# Patient Record
Sex: Female | Born: 1950 | Race: White | Hispanic: No | Marital: Married | Smoking: Never smoker
Health system: Southern US, Community
[De-identification: ages and names within clinical notes are randomized; demographics above are authoritative.]

## PROBLEM LIST (undated history)

## (undated) DIAGNOSIS — N390 Urinary tract infection, site not specified: Secondary | ICD-10-CM

## (undated) DIAGNOSIS — E119 Type 2 diabetes mellitus without complications: Secondary | ICD-10-CM

## (undated) DIAGNOSIS — K219 Gastro-esophageal reflux disease without esophagitis: Secondary | ICD-10-CM

## (undated) DIAGNOSIS — F329 Major depressive disorder, single episode, unspecified: Secondary | ICD-10-CM

## (undated) DIAGNOSIS — E785 Hyperlipidemia, unspecified: Secondary | ICD-10-CM

## (undated) DIAGNOSIS — F32A Depression, unspecified: Secondary | ICD-10-CM

## (undated) DIAGNOSIS — I10 Essential (primary) hypertension: Secondary | ICD-10-CM

## (undated) HISTORY — PX: TONSILLECTOMY: SUR1361

## (undated) HISTORY — PX: COLONOSCOPY: SHX174

## (undated) HISTORY — DX: Type 2 diabetes mellitus without complications: E11.9

## (undated) HISTORY — PX: CHOLECYSTECTOMY: SHX55

---

## 2006-10-09 ENCOUNTER — Encounter: Admission: RE | Admit: 2006-10-09 | Discharge: 2006-10-09 | Payer: Self-pay | Admitting: Unknown Physician Specialty

## 2006-12-07 ENCOUNTER — Ambulatory Visit: Payer: Self-pay | Admitting: Cardiology

## 2006-12-22 ENCOUNTER — Ambulatory Visit: Payer: Self-pay | Admitting: Cardiology

## 2008-03-07 ENCOUNTER — Encounter: Admission: RE | Admit: 2008-03-07 | Discharge: 2008-03-07 | Payer: Self-pay | Admitting: Unknown Physician Specialty

## 2009-07-24 ENCOUNTER — Encounter: Admission: RE | Admit: 2009-07-24 | Discharge: 2009-07-24 | Payer: Self-pay | Admitting: Obstetrics and Gynecology

## 2010-08-26 ENCOUNTER — Encounter: Admission: RE | Admit: 2010-08-26 | Discharge: 2010-08-26 | Payer: Self-pay | Admitting: Unknown Physician Specialty

## 2010-10-13 ENCOUNTER — Encounter: Payer: Self-pay | Admitting: Unknown Physician Specialty

## 2011-02-07 NOTE — Assessment & Plan Note (Signed)
[facility]                          [facility] OFFICE NOTE   Heather, Fleming                   MRN:          [id number]  DATE:12/07/2006                            DOB:          Jan 22, 1951    REFERRING PHYSICIAN:  Wyvonnia Lora   REASON FOR CONSULTATION:  Evaluation of atypical chest pain in a 60-year-  old female.   HISTORY OF PRESENT ILLNESS:  The patient is a 60 year old female with no  prior history of coronary artery disease. The patient does have a strong  family history for coronary artery disease. She has a brother who has a  history of sudden cardiac death, __________ by multiple cardiac risk  factors who suffered myocardial infarction and also underwent  defibrillator implant. The patient does have a strong family history of  tobacco use but she herself does not smoke.   The patient states that over the last several weeks she has had left-  sided submammary chest pain which she happens off and on. She states  this pain feels rather sharp, it lasts usually typically several  minutes, sometimes up to several hours. There is no definite  exacerbating factors, in particular exertion or emotional stress does  not bring on her symptoms. The patient states that she has no associated  symptoms of shortness of breath or diaphoresis and there is also no  radiation of the pain. The patient is rather active although she feels  she is deconditioned, activity does not particularly worsen her  symptoms. She is concerned that she might actually have gastroesophageal  reflux disease. She has modified her diet and has increased her fruit  and vegetables. She does have cardiac risk factors but she is compliant  with her medical regimen, in particular her Lipitor and antihypertensive  blood pressure medication. Her blood pressure is somewhat elevated in  the office today but she reports that on the recent visit with Dr.  Mora Appl her diastolic  blood pressure was less than 85. The patient denies  any orthopnea, PND, she has no palpitations, or syncope. She has no  nausea or vomiting. She has no fever or chills. She has no melena,  hematochezia, dysuria, or frequency. She has no syncope. The remainder  of her review of systems also notable for the fact that she reports  heartburn and frequent flatulence.   ALLERGIES:  CODEINE.   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 She does not smoke or drink.   FAMILY HISTORY:  Notable for mother and father that died both from heart  disease and diabetes mellitus. A brother has coronary artery disease and  is status post ICD implant secondary to sudden cardiac death.   CURRENT MEDICATIONS:  1. Astelin nasal spray secondary to chronic rhinitis.  2. Vitamins.  3. Amitriptyline 25 mg p.o. q.day.  4. Lipitor 40 mg p.o. q.day.  5. Omeprazole 20 mg p.o. q.day.  6. Lisinopril/hydrochlorothiazide 20/25 mg p.o. q.day.  7. Citalopram 20 mg p.o. q.day.  8. Fexofenadine 180 mg p.o. q.day.   REVIEW OF SYSTEMS:  As  reported above.   PHYSICAL EXAMINATION:  VITAL SIGNS: Blood pressure 142/88, heart rate is  105 beats per minute but during my physical examination the heart rate  was around 90 beats per minute.  GENERAL: Well-nourished, white female, in no apparent distress.  HEENT: Pupils are __________.  Sclerae and conjunctivae clear.  NECK: Supple, normal carotid upstrokes, no carotid bruits.  LUNGS: Clear breath sounds bilaterally.  HEART: Regular rate and rhythm, normal S1, S2. No murmur, rubs, or  gallops.  ABDOMEN: Soft, nontender. No rebound or guarding. Good bowel sounds.  EXTREMITY EXAM: Reveals no cyanosis, clubbing, or edema.  NEURO: Patient alert, oriented, grossly nonfocal.   A 12-lead electrocardiogram sinus tachycardia with borderline left  atrial enlargement, incomplete right bundle branch block, but otherwise   within normal limits.   PROBLEMS:  1. Atypical chest pain.  2. Rule out gastroesophageal reflux disease.  3. Dyslipidemia.  4. Hypertension.  5. Obesity and deconditioning.   PLAN:  1. The patient's symptoms are somewhat atypical for underlying      coronary artery disease. She does have risk factors but her pretest      probability is low.  2. The patient will proceed to further risk stratification and      diagnostic testing with stress Cardiolite study later this week.  3. The patient states that she has not had her cholesterol checked in      sometime and we will have that done at the same time of her stress      test.  4. If the Cardiolite studies within normal limit. I told the patient      that we can not rule out minor coronary artery disease, i.e.      arthrosclerosis but that she would be at low risk for future      cardiac events. I also do not think she needs any further cardiac      workup if the stress test is within normal limits, but rather      further evaluation for gastrointestinal problems as indicated.     Learta Codding, MD,FACC  Electronically Signed    GED/MedQ  DD: 12/07/2006  DT: 12/07/2006  Job #: [id number]   cc:   Lynita Lombard

## 2011-08-06 ENCOUNTER — Other Ambulatory Visit: Payer: Self-pay | Admitting: Unknown Physician Specialty

## 2011-08-06 DIAGNOSIS — Z1231 Encounter for screening mammogram for malignant neoplasm of breast: Secondary | ICD-10-CM

## 2011-09-05 ENCOUNTER — Ambulatory Visit
Admission: RE | Admit: 2011-09-05 | Discharge: 2011-09-05 | Disposition: A | Discharge status: Home / Self Care | Payer: Commercial Indemnity | Source: Ambulatory Visit | Attending: Unknown Physician Specialty | Admitting: Unknown Physician Specialty

## 2011-09-05 DIAGNOSIS — Z1231 Encounter for screening mammogram for malignant neoplasm of breast: Secondary | ICD-10-CM

## 2011-09-11 ENCOUNTER — Other Ambulatory Visit: Payer: Self-pay | Admitting: Unknown Physician Specialty

## 2011-09-11 DIAGNOSIS — R928 Other abnormal and inconclusive findings on diagnostic imaging of breast: Secondary | ICD-10-CM

## 2011-09-24 ENCOUNTER — Ambulatory Visit
Admission: RE | Admit: 2011-09-24 | Discharge: 2011-09-24 | Disposition: A | Discharge status: Home / Self Care | Payer: Commercial Indemnity | Source: Ambulatory Visit | Attending: Unknown Physician Specialty | Admitting: Unknown Physician Specialty

## 2011-09-24 DIAGNOSIS — R928 Other abnormal and inconclusive findings on diagnostic imaging of breast: Secondary | ICD-10-CM

## 2012-04-06 ENCOUNTER — Other Ambulatory Visit: Payer: Self-pay | Admitting: Unknown Physician Specialty

## 2012-04-06 DIAGNOSIS — N6489 Other specified disorders of breast: Secondary | ICD-10-CM

## 2012-04-20 ENCOUNTER — Ambulatory Visit
Admission: RE | Admit: 2012-04-20 | Discharge: 2012-04-20 | Disposition: A | Discharge status: Home / Self Care | Payer: Managed Care, Other (non HMO) | Source: Ambulatory Visit | Attending: Unknown Physician Specialty | Admitting: Unknown Physician Specialty

## 2012-04-20 DIAGNOSIS — N6489 Other specified disorders of breast: Secondary | ICD-10-CM

## 2012-10-08 ENCOUNTER — Other Ambulatory Visit: Payer: Self-pay | Admitting: Unknown Physician Specialty

## 2012-10-08 DIAGNOSIS — N6489 Other specified disorders of breast: Secondary | ICD-10-CM

## 2012-10-19 ENCOUNTER — Ambulatory Visit
Admission: RE | Admit: 2012-10-19 | Discharge: 2012-10-19 | Disposition: A | Discharge status: Home / Self Care | Payer: Managed Care, Other (non HMO) | Source: Ambulatory Visit | Attending: Unknown Physician Specialty | Admitting: Unknown Physician Specialty

## 2012-10-19 DIAGNOSIS — N6489 Other specified disorders of breast: Secondary | ICD-10-CM

## 2014-01-11 ENCOUNTER — Other Ambulatory Visit: Payer: Self-pay

## 2014-01-11 DIAGNOSIS — Z1231 Encounter for screening mammogram for malignant neoplasm of breast: Secondary | ICD-10-CM

## 2014-02-06 ENCOUNTER — Ambulatory Visit: Payer: Managed Care, Other (non HMO)

## 2014-08-24 ENCOUNTER — Encounter (INDEPENDENT_AMBULATORY_CARE_PROVIDER_SITE_OTHER): Payer: Self-pay

## 2014-08-24 ENCOUNTER — Ambulatory Visit
Admission: RE | Admit: 2014-08-24 | Discharge: 2014-08-24 | Disposition: A | Discharge status: Home / Self Care | Payer: Medicare Other | Source: Ambulatory Visit

## 2014-08-24 DIAGNOSIS — Z1231 Encounter for screening mammogram for malignant neoplasm of breast: Secondary | ICD-10-CM

## 2014-10-19 ENCOUNTER — Encounter (INDEPENDENT_AMBULATORY_CARE_PROVIDER_SITE_OTHER): Payer: Self-pay | Admitting: *Deleted

## 2014-10-19 ENCOUNTER — Encounter (INDEPENDENT_AMBULATORY_CARE_PROVIDER_SITE_OTHER): Payer: Self-pay

## 2015-03-21 ENCOUNTER — Ambulatory Visit
Admission: RE | Admit: 2015-03-21 | Discharge: 2015-03-21 | Disposition: A | Discharge status: Home / Self Care | Payer: Medicare Other | Source: Ambulatory Visit | Attending: Otolaryngology | Admitting: Otolaryngology

## 2015-03-21 ENCOUNTER — Other Ambulatory Visit: Payer: Self-pay | Admitting: Otolaryngology

## 2015-03-21 DIAGNOSIS — J329 Chronic sinusitis, unspecified: Secondary | ICD-10-CM

## 2015-09-06 ENCOUNTER — Other Ambulatory Visit: Payer: Self-pay

## 2015-09-06 DIAGNOSIS — Z1231 Encounter for screening mammogram for malignant neoplasm of breast: Secondary | ICD-10-CM

## 2015-09-12 ENCOUNTER — Ambulatory Visit
Admission: RE | Admit: 2015-09-12 | Discharge: 2015-09-12 | Disposition: A | Discharge status: Home / Self Care | Payer: Medicare Other | Source: Ambulatory Visit

## 2015-09-12 DIAGNOSIS — Z1231 Encounter for screening mammogram for malignant neoplasm of breast: Secondary | ICD-10-CM

## 2015-11-02 ENCOUNTER — Emergency Department (HOSPITAL_COMMUNITY)
Admission: EM | Admit: 2015-11-02 | Discharge: 2015-11-02 | Disposition: A | Discharge status: Home / Self Care | Payer: Medicare Other | Attending: Emergency Medicine | Admitting: Emergency Medicine

## 2015-11-02 ENCOUNTER — Emergency Department (HOSPITAL_COMMUNITY): Payer: Medicare Other

## 2015-11-02 ENCOUNTER — Encounter (HOSPITAL_COMMUNITY): Payer: Self-pay | Admitting: Nurse Practitioner

## 2015-11-02 DIAGNOSIS — Z7951 Long term (current) use of inhaled steroids: Secondary | ICD-10-CM | POA: Insufficient documentation

## 2015-11-02 DIAGNOSIS — K219 Gastro-esophageal reflux disease without esophagitis: Secondary | ICD-10-CM | POA: Diagnosis not present

## 2015-11-02 DIAGNOSIS — N390 Urinary tract infection, site not specified: Secondary | ICD-10-CM | POA: Insufficient documentation

## 2015-11-02 DIAGNOSIS — I10 Essential (primary) hypertension: Secondary | ICD-10-CM | POA: Insufficient documentation

## 2015-11-02 DIAGNOSIS — Z79899 Other long term (current) drug therapy: Secondary | ICD-10-CM | POA: Diagnosis not present

## 2015-11-02 DIAGNOSIS — E785 Hyperlipidemia, unspecified: Secondary | ICD-10-CM | POA: Insufficient documentation

## 2015-11-02 DIAGNOSIS — R0602 Shortness of breath: Secondary | ICD-10-CM | POA: Diagnosis not present

## 2015-11-02 DIAGNOSIS — R109 Unspecified abdominal pain: Secondary | ICD-10-CM | POA: Diagnosis present

## 2015-11-02 DIAGNOSIS — Z91040 Latex allergy status: Secondary | ICD-10-CM | POA: Diagnosis not present

## 2015-11-02 HISTORY — DX: Essential (primary) hypertension: I10

## 2015-11-02 HISTORY — DX: Urinary tract infection, site not specified: N39.0

## 2015-11-02 HISTORY — DX: Hyperlipidemia, unspecified: E78.5

## 2015-11-02 HISTORY — DX: Gastro-esophageal reflux disease without esophagitis: K21.9

## 2015-11-02 LAB — BASIC METABOLIC PANEL
Anion gap: 11 (ref 5–15)
BUN: 12 mg/dL (ref 6–20)
CALCIUM: 9.3 mg/dL (ref 8.9–10.3)
CO2: 26 mmol/L (ref 22–32)
CREATININE: 0.88 mg/dL (ref 0.44–1.00)
Chloride: 104 mmol/L (ref 101–111)
GFR calc Af Amer: >60 mL/min (ref >60)
GFR calc non Af Amer: >60 mL/min (ref >60)
GLUCOSE: 105 mg/dL — AB (ref 65–99)
Potassium: 4.3 mmol/L (ref 3.5–5.1)
Sodium: 141 mmol/L (ref 135–145)

## 2015-11-02 LAB — CBC
HEMATOCRIT: 39.1 % (ref 36.0–46.0)
Hemoglobin: 13 g/dL (ref 12.0–15.0)
MCH: 30.5 pg (ref 26.0–34.0)
MCHC: 33.2 g/dL (ref 30.0–36.0)
MCV: 91.8 fL (ref 78.0–100.0)
Platelets: 278 10*3/uL (ref 150–400)
RBC: 4.26 MIL/uL (ref 3.87–5.11)
RDW: 13 % (ref 11.5–15.5)
WBC: 8.7 10*3/uL (ref 4.0–10.5)

## 2015-11-02 LAB — I-STAT TROPONIN, ED: Troponin i, poc: 0 ng/mL (ref 0.00–0.08)

## 2015-11-02 MED ORDER — SULFAMETHOXAZOLE-TRIMETHOPRIM 800-160 MG PO TABS
1.0000 | ORAL_TABLET | Freq: Once | ORAL | Status: AC
  Administered 2015-11-02: 1 via ORAL
  Filled 2015-11-02: qty 1

## 2015-11-02 NOTE — ED Notes (Addendum)
She c/o 1 day history of severe indigestion and gas. Onset after eating a chalupa at lunch. She noticed symptoms were worse when lying down last night and then she began to have SOB and abd tenderness which has persisted until today. She took her acid reducers with no relief. Nothing aggravates or alleviates the symptoms. She went to [facility] for this and they found a UTI but wanted her evaluated further for abd pain and sob. A&Ox4, resp e/u. She increased her gabapentin dosage 2 weeks ago.

## 2015-11-02 NOTE — ED Provider Notes (Signed)
CSN: [id number]     Arrival date & time 11/02/15  1355 History   First MD Initiated Contact with Patient 11/02/15 2156     Chief Complaint  Patient presents with  . Shortness of Breath      Patient is a 65 y.o. female presenting with shortness of breath. The history is provided by the patient.  Shortness of Breath Associated symptoms: abdominal pain   Associated symptoms: no chest pain, no fever, no headaches, no rash and no vomiting    patient had some shortness of breath last night. She was seen at urgent care and sent to the ER further evaluation. She been waiting around 8 hours to see me. She states she was sleeping and felt so her reflux. States she felt little short of breath with it. No chest pain. She did have some mild abdominal pain. She states she felt as if she had a urinary tract infection she states that she was diagnosed with one at the urgent care. No fevers. No chills. No swelling or legs. No flank pain. She does have a history of GERD.  Past Medical History  Diagnosis Date  . GERD (gastroesophageal reflux disease)   . Hypertension   . Hyperlipemia   . UTI (urinary tract infection)    Past Surgical History  Procedure Laterality Date  . Cholecystectomy    . Tonsillectomy     History reviewed. No pertinent family history. Social History  Substance Use Topics  . Smoking status: Never Smoker   . Smokeless tobacco: None  . Alcohol Use: Yes     Comment: occasionally   OB History    No data available     Review of Systems  Constitutional: Negative for fever, activity change and appetite change.  Eyes: Negative for pain.  Respiratory: Positive for shortness of breath. Negative for chest tightness.   Cardiovascular: Negative for chest pain and leg swelling.  Gastrointestinal: Positive for abdominal pain. Negative for nausea, vomiting and diarrhea.  Genitourinary: Positive for dysuria. Negative for flank pain.  Musculoskeletal: Negative for back pain and neck  stiffness.  Skin: Negative for rash and wound.  Neurological: Negative for weakness, numbness and headaches.  Psychiatric/Behavioral: Negative for behavioral problems.      Allergies  Codeine and Latex  Home Medications   Prior to Admission medications   Medication Sig Start Date End Date Taking? Authorizing Provider  atorvastatin (LIPITOR) 40 MG tablet Take 40 mg by mouth daily. 09/25/15  Yes Historical Provider, MD  citalopram (CELEXA) 20 MG tablet Take 20 mg by mouth daily. 09/25/15  Yes Historical Provider, MD  fluticasone (FLONASE) 50 MCG/ACT nasal spray Place 1 spray into both nostrils daily.  09/25/15  Yes Historical Provider, MD  gabapentin (NEURONTIN) 300 MG capsule Take 300 mg by mouth 3 (three) times daily. 11/02/15  Yes Historical Provider, MD  losartan-hydrochlorothiazide (HYZAAR) 100-25 MG tablet Take 1 tablet by mouth daily. 09/25/15  Yes Historical Provider, MD  omeprazole (PRILOSEC) 20 MG capsule Take 20 mg by mouth daily. 09/25/15  Yes Historical Provider, MD  potassium chloride (K-DUR,KLOR-CON) 10 MEQ tablet Take 10 mEq by mouth 2 (two) times daily. 09/25/15  Yes Historical Provider, MD  propranolol (INDERAL) 20 MG tablet Take 20 mg by mouth 2 (two) times daily. 09/25/15  Yes Historical Provider, MD  sulfamethoxazole-trimethoprim (BACTRIM DS,SEPTRA DS) 800-160 MG tablet Take 1 tablet by mouth 2 (two) times daily. 11/02/15  Yes Historical Provider, MD   BP 145/92 mmHg  Pulse 63  Temp(Src)  98.1 F (36.7 C) (Oral)  Resp 17  SpO2 97% Physical Exam  Constitutional: She is oriented to person, place, and time. She appears well-developed and well-nourished.  HENT:  Head: Normocephalic and atraumatic.  Eyes: Pupils are equal, round, and reactive to light.  Neck: No JVD present.  Cardiovascular: Normal rate, regular rhythm and normal heart sounds.   No murmur heard. Pulmonary/Chest: Effort normal and breath sounds normal. No respiratory distress.  Abdominal: Soft. Bowel sounds are  normal. She exhibits no distension. There is tenderness. There is no rebound and no guarding.  Mild suprapubic to right lower quadrant tenderness. No rebound or guarding. No mass.  Musculoskeletal: Normal range of motion.  Neurological: She is alert and oriented to person, place, and time. No cranial nerve deficit.  Skin: Skin is warm and dry.  Psychiatric: She has a normal mood and affect. Her speech is normal.  Nursing note and vitals reviewed.   ED Course  Procedures (including critical care time) Labs Review Labs Reviewed  BASIC METABOLIC PANEL - Abnormal; Notable for the following:    Glucose, Bld 105 (*)    All other components within normal limits  CBC  I-STAT TROPOININ, ED    Imaging Review Dg Chest 2 View  11/02/2015  CLINICAL DATA:  New onset of shortness of breath. EXAM: CHEST  2 VIEW COMPARISON:  None. FINDINGS: The heart size and mediastinal contours are within normal limits. Both lungs are clear. The visualized skeletal structures are unremarkable. Calcification in the arch of the aorta. No effusions. IMPRESSION: No active cardiopulmonary disease.  Aortic atherosclerosis. Electronically Signed   By: Lorriane Shire M.D.   On: 11/02/2015 14:30   I have personally reviewed and evaluated these images and lab results as part of my medical decision-making.   EKG Interpretation   Date/Time:  Friday November 02 2015 14:02:12 EST Ventricular Rate:  64 PR Interval:  164 QRS Duration: 92 QT Interval:  424 QTC Calculation: 437 R Axis:   68 Text Interpretation:  Normal sinus rhythm Normal ECG Confirmed by  Alvino Chapel  MD, Ovid Curd [phone]) on 11/02/2015 9:56:48 PM      MDM   Final diagnoses:  Lower urinary tract infection    Patient with dyspnea. EKG x-ray and lab work reassuring. Doubt pulmonary embolism doubt pneumonia. Doubt cardiac cause. Does have some lower abdominal pain. No epigastric tenderness. May be related to UTI. Reportedly a positive urinalysis at the urgent  care. Was started on Bactrim there and will give the first dose here. Will discharge home.    Davonna Belling, MD 11/02/15 2214

## 2015-11-02 NOTE — Discharge Instructions (Signed)

## 2015-11-02 NOTE — ED Notes (Signed)
Pt verbalized understanding of antibiotic regimen for treatment of UTI. Pt stable and NAD

## 2015-12-27 ENCOUNTER — Telehealth (INDEPENDENT_AMBULATORY_CARE_PROVIDER_SITE_OTHER): Payer: Self-pay | Admitting: *Deleted

## 2015-12-27 NOTE — Telephone Encounter (Signed)
Patient called wanting to schedule an appointment to setup a colonoscopy.  Letter was sent to patient 09/2014  [place]

## 2015-12-28 ENCOUNTER — Other Ambulatory Visit (INDEPENDENT_AMBULATORY_CARE_PROVIDER_SITE_OTHER): Payer: Self-pay | Admitting: *Deleted

## 2015-12-28 DIAGNOSIS — Z86010 Personal history of colonic polyps: Secondary | ICD-10-CM

## 2015-12-28 NOTE — Telephone Encounter (Signed)
Left message for patient to call to schedule. 

## 2015-12-28 NOTE — Telephone Encounter (Signed)
TCS sch'd 04/03/16, patient aware

## 2016-02-28 ENCOUNTER — Other Ambulatory Visit (INDEPENDENT_AMBULATORY_CARE_PROVIDER_SITE_OTHER): Payer: Self-pay | Admitting: *Deleted

## 2016-02-28 ENCOUNTER — Encounter (INDEPENDENT_AMBULATORY_CARE_PROVIDER_SITE_OTHER): Payer: Self-pay | Admitting: *Deleted

## 2016-02-28 NOTE — Telephone Encounter (Signed)
Patient needs trilyte 

## 2016-03-03 MED ORDER — PEG 3350-KCL-NA BICARB-NACL 420 G PO SOLR
4000.0000 mL | Freq: Once | ORAL | Status: DC
Start: 2016-03-03 — End: 2016-04-03

## 2016-03-05 ENCOUNTER — Telehealth (INDEPENDENT_AMBULATORY_CARE_PROVIDER_SITE_OTHER): Payer: Self-pay | Admitting: *Deleted

## 2016-03-05 NOTE — Telephone Encounter (Signed)
Referring MD/PCP: tapper   Procedure: tcs  Reason/Indication:  Hx polyps  Has patient had this procedure before?  Yes, 2011 -- scanned  If so, when, by whom and where?    Is there a family history of colon cancer?  no  Who?  What age when diagnosed?    Is patient diabetic?   borderline      Does patient have prosthetic heart valve or mechanical valve?  no  Do you have a pacemaker?  no  Has patient ever had endocarditis? no  Has patient had joint replacement within last 12 months?  no  Does patient tend to be constipated or take laxatives? no  Does patient have a history of alcohol/drug use?  no  Is patient on Coumadin, Plavix and/or Aspirin? yes  Medications: asa 81 mg daily, citalopram 20 mg daily, propranolol 20 mg bid, potassium 10 meq bid, gabapentin 300 mg bid, atorvastatin 40 mg daily, omeprazole 20 mg daily, losartan/hctz 100/25 mg daily, cetirizine 10 mg daily  Allergies: codeine, latex  Medication Adjustment: asa 2 days  Procedure date & time: 04/03/16 at 10:00

## 2016-03-05 NOTE — Telephone Encounter (Signed)
agree

## 2016-04-03 ENCOUNTER — Encounter (HOSPITAL_COMMUNITY): Payer: Self-pay | Admitting: *Deleted

## 2016-04-03 ENCOUNTER — Ambulatory Visit (HOSPITAL_COMMUNITY)
Admission: RE | Admit: 2016-04-03 | Discharge: 2016-04-03 | Disposition: A | Discharge status: Home / Self Care | Payer: Medicare Other | Source: Ambulatory Visit | Attending: Internal Medicine | Admitting: Internal Medicine

## 2016-04-03 ENCOUNTER — Encounter (HOSPITAL_COMMUNITY)
Admission: RE | Disposition: A | Discharge status: Home / Self Care | Payer: Self-pay | Source: Ambulatory Visit | Attending: Internal Medicine

## 2016-04-03 DIAGNOSIS — I10 Essential (primary) hypertension: Secondary | ICD-10-CM | POA: Insufficient documentation

## 2016-04-03 DIAGNOSIS — Z79899 Other long term (current) drug therapy: Secondary | ICD-10-CM | POA: Diagnosis not present

## 2016-04-03 DIAGNOSIS — Z885 Allergy status to narcotic agent status: Secondary | ICD-10-CM | POA: Insufficient documentation

## 2016-04-03 DIAGNOSIS — Z8371 Family history of colonic polyps: Secondary | ICD-10-CM | POA: Diagnosis not present

## 2016-04-03 DIAGNOSIS — F329 Major depressive disorder, single episode, unspecified: Secondary | ICD-10-CM | POA: Insufficient documentation

## 2016-04-03 DIAGNOSIS — Z09 Encounter for follow-up examination after completed treatment for conditions other than malignant neoplasm: Secondary | ICD-10-CM | POA: Diagnosis not present

## 2016-04-03 DIAGNOSIS — Z86010 Personal history of colonic polyps: Secondary | ICD-10-CM | POA: Diagnosis not present

## 2016-04-03 DIAGNOSIS — E785 Hyperlipidemia, unspecified: Secondary | ICD-10-CM | POA: Diagnosis not present

## 2016-04-03 DIAGNOSIS — K219 Gastro-esophageal reflux disease without esophagitis: Secondary | ICD-10-CM | POA: Diagnosis not present

## 2016-04-03 DIAGNOSIS — K5730 Diverticulosis of large intestine without perforation or abscess without bleeding: Secondary | ICD-10-CM | POA: Insufficient documentation

## 2016-04-03 DIAGNOSIS — D123 Benign neoplasm of transverse colon: Secondary | ICD-10-CM

## 2016-04-03 DIAGNOSIS — Z1211 Encounter for screening for malignant neoplasm of colon: Secondary | ICD-10-CM | POA: Insufficient documentation

## 2016-04-03 DIAGNOSIS — Z9049 Acquired absence of other specified parts of digestive tract: Secondary | ICD-10-CM | POA: Diagnosis not present

## 2016-04-03 DIAGNOSIS — Z91040 Latex allergy status: Secondary | ICD-10-CM | POA: Diagnosis not present

## 2016-04-03 HISTORY — DX: Depression, unspecified: F32.A

## 2016-04-03 HISTORY — PX: COLONOSCOPY: SHX5424

## 2016-04-03 HISTORY — DX: Major depressive disorder, single episode, unspecified: F32.9

## 2016-04-03 SURGERY — COLONOSCOPY
Anesthesia: Moderate Sedation

## 2016-04-03 MED ORDER — MEPERIDINE HCL 50 MG/ML IJ SOLN
INTRAMUSCULAR | Status: AC
  Filled 2016-04-03: qty 1

## 2016-04-03 MED ORDER — MIDAZOLAM HCL 5 MG/5ML IJ SOLN
INTRAMUSCULAR | Status: AC
  Filled 2016-04-03: qty 10

## 2016-04-03 MED ORDER — MEPERIDINE HCL 50 MG/ML IJ SOLN
INTRAMUSCULAR | Status: DC | PRN
  Administered 2016-04-03 (×3): 25 mg via INTRAVENOUS

## 2016-04-03 MED ORDER — SODIUM CHLORIDE 0.9 % IV SOLN
INTRAVENOUS | Status: DC
  Administered 2016-04-03: 09:00:00 via INTRAVENOUS

## 2016-04-03 MED ORDER — MIDAZOLAM HCL 5 MG/5ML IJ SOLN
INTRAMUSCULAR | Status: DC | PRN
  Administered 2016-04-03 (×4): 2 mg via INTRAVENOUS

## 2016-04-03 NOTE — H&P (Signed)
Heather Fleming is an 65 y.o. female.   Chief Complaint: Patient is here for colonoscopy. HPI: Patient is 65 year old Caucasian female was history of colonic adenomas and is here for colonoscopy. Her last colonoscopy was in February 2011 she had 2 small tubular adenomas removed. She had another tubular adenoma removed on prior colonoscopy of 2007. She denies abdominal pain change in bowel habits or rectal bleeding. Family history is positive for colonic polyps removed in her who died at 25 of non-GI issues.  Past Medical History  Diagnosis Date  . GERD (gastroesophageal reflux disease)   . Hypertension   . Hyperlipemia   . UTI (urinary tract infection)   . Depression     Past Surgical History  Procedure Laterality Date  . Cholecystectomy    . Tonsillectomy    . Colonoscopy      History reviewed. No pertinent family history. Social History:  reports that she has never smoked. She does not have any smokeless tobacco history on file. She reports that she drinks alcohol. She reports that she does not use illicit drugs.  Allergies:  Allergies  Allergen Reactions  . Codeine Itching  . Latex Rash    Medications Prior to Admission  Medication Sig Dispense Refill  . atorvastatin (LIPITOR) 40 MG tablet Take 40 mg by mouth daily.    . cetirizine (ZYRTEC) 10 MG tablet Take 10 mg by mouth daily.    . citalopram (CELEXA) 20 MG tablet Take 20 mg by mouth daily.    . fluticasone (FLONASE) 50 MCG/ACT nasal spray Place 1 spray into both nostrils daily.     Marland Kitchen gabapentin (NEURONTIN) 300 MG capsule Take 300 mg by mouth 2 (two) times daily.     Marland Kitchen losartan-hydrochlorothiazide (HYZAAR) 100-25 MG tablet Take 1 tablet by mouth daily.    Marland Kitchen omeprazole (PRILOSEC) 20 MG capsule Take 20 mg by mouth daily.    . polyethylene glycol-electrolytes (TRILYTE) 420 g solution Take 4,000 mLs by mouth once. 4000 mL 0  . potassium chloride (K-DUR,KLOR-CON) 10 MEQ tablet Take 10 mEq by mouth 2 (two) times daily.    .  propranolol (INDERAL) 20 MG tablet Take 20 mg by mouth 2 (two) times daily.    Marland Kitchen tiZANidine (ZANAFLEX) 4 MG capsule Take 4 mg by mouth 3 (three) times daily as needed for muscle spasms.      No results found for this or any previous visit (from the past 48 hour(s)). No results found.  ROS  Blood pressure 157/68, pulse 74, temperature 98.8 F (37.1 C), temperature source Oral, resp. rate 20, height 5' 4.5" (1.638 m), weight 220 lb (99.791 kg), SpO2 98 %. Physical Exam  Constitutional: She appears well-developed and well-nourished.  HENT:  Mouth/Throat: Oropharynx is clear and moist.  Eyes: Conjunctivae are normal. No scleral icterus.  Neck: No thyromegaly present.  Cardiovascular: Normal rate, regular rhythm and normal heart sounds.   No murmur heard. Respiratory: Effort normal and breath sounds normal.  GI: Soft. She exhibits no distension and no mass. There is no tenderness.  Musculoskeletal: She exhibits no edema.  Lymphadenopathy:    She has no cervical adenopathy.  Neurological: She is alert.  Skin: Skin is warm and dry.     Assessment/Plan History of colonic adenomas. Surveillance colonoscopy.  Hildred Laser, MD 04/03/2016, 9:39 AM

## 2016-04-03 NOTE — Discharge Instructions (Signed)
Resume usual medications and diet. No driving for 24 hours. Physician will call with biopsy results. Next colonoscopy in 5 years.  Colonoscopy, Care After These instructions give you information on caring for yourself after your procedure. Your doctor may also give you more specific instructions. Call your doctor if you have any problems or questions after your procedure. HOME CARE  Do not drive for 24 hours.  Do not sign important papers or use machinery for 24 hours.  You may shower.  You may go back to your usual activities, but go slower for the first 24 hours.  Take rest breaks often during the first 24 hours.  Walk around or use warm packs on your belly (abdomen) if you have belly cramping or gas.  Drink enough fluids to keep your pee (urine) clear or pale yellow.  Resume your normal diet. Avoid heavy or fried foods.  Avoid drinking alcohol for 24 hours or as told by your doctor.  Only take medicines as told by your doctor. If a tissue sample (biopsy) was taken during the procedure:   Do not take aspirin or blood thinners for 7 days, or as told by your doctor.  Do not drink alcohol for 7 days, or as told by your doctor.  Eat soft foods for the first 24 hours. GET HELP IF: You still have a small amount of blood in your poop (stool) 2-3 days after the procedure. GET HELP RIGHT AWAY IF:  You have more than a small amount of blood in your poop.  You see clumps of tissue (blood clots) in your poop.  Your belly is puffy (swollen).  You feel sick to your stomach (nauseous) or throw up (vomit).  You have a fever.  You have belly pain that gets worse and medicine does not help. MAKE SURE YOU:  Understand these instructions.  Will watch your condition.  Will get help right away if you are not doing well or get worse.   This information is not intended to replace advice given to you by your health care provider. Make sure you discuss any questions you have with  your health care provider.   Document Released: 10/11/2010 Document Revised: 09/13/2013 Document Reviewed: 05/16/2013 Elsevier Interactive Patient Education 2016 [employer]   Colon Polyps Polyps are lumps of extra tissue growing inside the body. Polyps can grow in the large intestine (colon). Most colon polyps are noncancerous (benign). However, some colon polyps can become cancerous over time. Polyps that are larger than a pea may be harmful. To be safe, caregivers remove and test all polyps. CAUSES  Polyps form when mutations in the genes cause your cells to grow and divide even though no more tissue is needed. RISK FACTORS There are a number of risk factors that can increase your chances of getting colon polyps. They include:  Being older than 50 years.  Family history of colon polyps or colon cancer.  Long-term colon diseases, such as colitis or Crohn disease.  Being overweight.  Smoking.  Being inactive.  Drinking too much alcohol. SYMPTOMS  Most small polyps do not cause symptoms. If symptoms are present, they may include:  Blood in the stool. The stool may look dark red or black.  Constipation or diarrhea that lasts longer than 1 week. DIAGNOSIS People often do not know they have polyps until their caregiver finds them during a regular checkup. Your caregiver can use 4 tests to check for polyps:  Digital rectal exam. The caregiver wears gloves and  feels inside the rectum. This test would find polyps only in the rectum.  Barium enema. The caregiver puts a liquid called barium into your rectum before taking X-rays of your colon. Barium makes your colon look white. Polyps are dark, so they are easy to see in the X-ray pictures.  Sigmoidoscopy. A thin, flexible tube (sigmoidoscope) is placed into your rectum. The sigmoidoscope has a light and tiny camera in it. The caregiver uses the sigmoidoscope to look at the last third of your colon.  Colonoscopy. This test is like  sigmoidoscopy, but the caregiver looks at the entire colon. This is the most common method for finding and removing polyps. TREATMENT  Any polyps will be removed during a sigmoidoscopy or colonoscopy. The polyps are then tested for cancer. PREVENTION  To help lower your risk of getting more colon polyps:  Eat plenty of fruits and vegetables. Avoid eating fatty foods.  Do not smoke.  Avoid drinking alcohol.  Exercise every day.  Lose weight if recommended by your caregiver.  Eat plenty of calcium and folate. Foods that are rich in calcium include milk, cheese, and broccoli. Foods that are rich in folate include chickpeas, kidney beans, and spinach. HOME CARE INSTRUCTIONS Keep all follow-up appointments as directed by your caregiver. You may need periodic exams to check for polyps. SEEK MEDICAL CARE IF: You notice bleeding during a bowel movement.   This information is not intended to replace advice given to you by your health care provider. Make sure you discuss any questions you have with your health care provider.   Document Released: 06/04/2004 Document Revised: 09/29/2014 Document Reviewed: 11/18/2011 Elsevier Interactive Patient Education 2016 [employer].   Diverticulosis Diverticulosis is the condition that develops when small pouches (diverticula) form in the wall of your colon. Your colon, or large intestine, is where water is absorbed and stool is formed. The pouches form when the inside layer of your colon pushes through weak spots in the outer layers of your colon. CAUSES  No one knows exactly what causes diverticulosis. RISK FACTORS  Being older than 40. Your risk for this condition increases with age. Diverticulosis is rare in people younger than 40 years. By age 30, almost everyone has it.  Eating a low-fiber diet.  Being frequently constipated.  Being overweight.  Not getting enough exercise.  Smoking.  Taking over-the-counter pain medicines, like  aspirin and ibuprofen. SYMPTOMS  Most people with diverticulosis do not have symptoms. DIAGNOSIS  Because diverticulosis often has no symptoms, health care providers often discover the condition during an exam for other colon problems. In many cases, a health care provider will diagnose diverticulosis while using a flexible scope to examine the colon (colonoscopy). TREATMENT  If you have never developed an infection related to diverticulosis, you may not need treatment. If you have had an infection before, treatment may include:  Eating more fruits, vegetables, and grains.  Taking a fiber supplement.  Taking a live bacteria supplement (probiotic).  Taking medicine to relax your colon. HOME CARE INSTRUCTIONS   Drink at least 6-8 glasses of water each day to prevent constipation.  Try not to strain when you have a bowel movement.  Keep all follow-up appointments. If you have had an infection before:  Increase the fiber in your diet as directed by your health care provider or dietitian.  Take a dietary fiber supplement if your health care provider approves.  Only take medicines as directed by your health care provider. SEEK MEDICAL CARE IF:  You have abdominal pain.  You have bloating.  You have cramps.  You have not gone to the bathroom in 3 days. SEEK IMMEDIATE MEDICAL CARE IF:   Your pain gets worse.  Yourbloating becomes very bad.  You have a fever or chills, and your symptoms suddenly get worse.  You begin vomiting.  You have bowel movements that are bloody or black. MAKE SURE YOU:  Understand these instructions.  Will watch your condition.  Will get help right away if you are not doing well or get worse.   This information is not intended to replace advice given to you by your health care provider. Make sure you discuss any questions you have with your health care provider.   Document Released: 06/05/2004 Document Revised: 09/13/2013 Document Reviewed:  08/03/2013 Elsevier Interactive Patient Education [employer].

## 2016-04-03 NOTE — Op Note (Signed)
[facility] Patient Name: Heather Fleming Procedure Date: 04/03/2016 9:34 AM MRN: [id number] Date of Birth: [birth year] Attending MD: Hildred Laser , MD CSN: [id number] Age: 65 Admit Type: Outpatient Procedure:                Colonoscopy Indications:              High risk colon cancer surveillance: Personal                            history of colonic polyps Providers:                Hildred Laser, MD, Lurline Del, RN Referring MD:             Zella Richer. Scotty Court, MD Medicines:                Meperidine 75 mg IV, Midazolam 8 mg IV Complications:            No immediate complications. Estimated Blood Loss:     Estimated blood loss: none. Procedure:                Pre-Anesthesia Assessment:                           - Prior to the procedure, a History and Physical                            was performed, and patient medications and                            allergies were reviewed. The patient's tolerance of                            previous anesthesia was also reviewed. The risks                            and benefits of the procedure and the sedation                            options and risks were discussed with the patient.                            All questions were answered, and informed consent                            was obtained. Prior Anticoagulants: The patient has                            taken no previous anticoagulant or antiplatelet                            agents. ASA Grade Assessment: II - A patient with                            mild systemic disease. After reviewing the risks  and benefits, the patient was deemed in                            satisfactory condition to undergo the procedure.                           After obtaining informed consent, the colonoscope                            was passed under direct vision. Throughout the                            procedure, the patient's blood pressure, pulse, and                             oxygen saturations were monitored continuously. The                            [id number]) scope was introduced through                            the anus and advanced to the the cecum, identified                            by appendiceal orifice and ileocecal valve. The                            colonoscopy was performed without difficulty. The                            patient tolerated the procedure well. The quality                            of the bowel preparation was excellent. The                            ileocecal valve, appendiceal orifice, and rectum                            were photographed. Scope In: 9:52:34 AM Scope Out: 10:13:51 AM Scope Withdrawal Time: 0 hours 8 minutes 58 seconds  Total Procedure Duration: 0 hours 21 minutes 17 seconds  Findings:      A 3 mm polyp was found in the hepatic flexure. The polyp was sessile.       Biopsies were taken with a cold forceps for histology.      A single small-mouthed diverticulum was found in the sigmoid colon.      The retroflexed view of the distal rectum and anal verge was normal and       showed no anal or rectal abnormalities. Impression:               - One 3 mm polyp at the hepatic flexure. Biopsied.                           -  Diverticulosis in the sigmoid colon. Moderate Sedation:      Moderate (conscious) sedation was administered by the endoscopy nurse       and supervised by the endoscopist. The following parameters were       monitored: oxygen saturation, heart rate, blood pressure, CO2       capnography and response to care. Total physician intraservice time was       28 minutes. Recommendation:           - Patient has a contact number available for                            emergencies. The signs and symptoms of potential                            delayed complications were discussed with the                            patient. Return to normal activities tomorrow.                             Written discharge instructions were provided to the                            patient.                           - Resume previous diet.                           - Continue present medications.                           - Await pathology results.                           - Repeat colonoscopy in 5 years for surveillance. Procedure Code(s):        --- Professional ---                           [phone], Colonoscopy, flexible; with biopsy, single                            or multiple                           99152, Moderate sedation services provided by the                            same physician or other qualified health care                            professional performing the diagnostic or                            therapeutic service that the sedation supports,  requiring the presence of an independent trained                            observer to assist in the monitoring of the                            patient's level of consciousness and physiological                            status; initial 15 minutes of intraservice time,                            patient age 35 years or older                           [phone], Moderate sedation services; each additional                            15 minutes intraservice time Diagnosis Code(s):        --- Professional ---                           Z86.010, Personal history of colonic polyps                           D12.3, Benign neoplasm of transverse colon (hepatic                            flexure or splenic flexure)                           K57.30, Diverticulosis of large intestine without                            perforation or abscess without bleeding CPT copyright 2016 American [employer]. All rights reserved. The codes documented in this report are preliminary and upon coder review may  be revised to meet current compliance requirements. Hildred Laser, MD Hildred Laser,  MD 04/03/2016 10:23:12 AM This report has been signed electronically. Number of Addenda: 0

## 2016-04-04 ENCOUNTER — Encounter (HOSPITAL_COMMUNITY): Payer: Self-pay | Admitting: Internal Medicine

## 2017-06-26 ENCOUNTER — Other Ambulatory Visit: Payer: Self-pay | Admitting: Family Medicine

## 2017-06-26 DIAGNOSIS — Z1231 Encounter for screening mammogram for malignant neoplasm of breast: Secondary | ICD-10-CM

## 2017-07-13 ENCOUNTER — Ambulatory Visit
Admission: RE | Admit: 2017-07-13 | Discharge: 2017-07-13 | Disposition: A | Discharge status: Home / Self Care | Payer: Medicare Other | Source: Ambulatory Visit | Attending: Family Medicine | Admitting: Family Medicine

## 2017-07-13 DIAGNOSIS — Z1231 Encounter for screening mammogram for malignant neoplasm of breast: Secondary | ICD-10-CM

## 2018-08-23 ENCOUNTER — Other Ambulatory Visit: Payer: Self-pay | Admitting: Family Medicine

## 2018-08-23 DIAGNOSIS — Z1231 Encounter for screening mammogram for malignant neoplasm of breast: Secondary | ICD-10-CM

## 2018-08-25 ENCOUNTER — Encounter: Payer: Self-pay | Admitting: Allergy & Immunology

## 2018-08-25 ENCOUNTER — Ambulatory Visit (INDEPENDENT_AMBULATORY_CARE_PROVIDER_SITE_OTHER): Payer: Medicare Other | Admitting: Allergy & Immunology

## 2018-08-25 VITALS — BP 120/60 | HR 69 | Temp 98.0°F | Resp 16 | Ht 64.0 in | Wt 203.8 lb

## 2018-08-25 DIAGNOSIS — J310 Chronic rhinitis: Secondary | ICD-10-CM | POA: Diagnosis not present

## 2018-08-25 DIAGNOSIS — G4489 Other headache syndrome: Secondary | ICD-10-CM

## 2018-08-25 MED ORDER — LEVOCETIRIZINE DIHYDROCHLORIDE 5 MG PO TABS: 5.0000 mg | ORAL_TABLET | Freq: Every evening | ORAL | 2 refills | Status: DC

## 2018-08-25 MED ORDER — AZELASTINE HCL 0.1 % NA SOLN: 2.0000 | Freq: Two times a day (BID) | NASAL | 2 refills | Status: DC

## 2018-08-25 NOTE — Progress Notes (Signed)
NEW PATIENT  Date of Service/Encounter:  08/25/18  Referring provider: Sandi Mealy, MD    Assessment:   Non-allergic rhinitis   Chronic headaches  Heather Fleming presents for an evaluation of chronic headaches and persistent sinus pressure.  She has been evaluated by an otolaryngologist earlier this year who apparently started her on nasal steroid rinses. This did provide some relief, but were nearly $100 a month for the prescription.  She does have a remote history of allergies and was on allergy shots for short period of time.  She does have a sinus CT from early 2019 that showed "inflammation", although we do not have the results of this here today.  Her testing today was negative to the entire panel, but we will confirm this with blood testing.  In the interim, we will continue with her Flonase and add Astelin to see if this can provide some synergistic benefit to her.  We are going to replace her Zyrtec with Xyzal as well.  I did offer her a referral to see another otolaryngologist for a second opinion and she did seem interested in this.  If all of this work-up is negative, we will consider a referral to neurology for evaluation of possible migraines.  Plan/Recommendations:   1. Chronic rhinitis - Testing today showed: negative to the entire panel - Copy of test results provided.  - We will confirm with blood testing.  - Stop taking: Zyrtec (cetirizine) - Continue with: Flonase (fluticasone) two sprays per nostril daily - Start taking: Xyzal (levocetirizine) 5mg  tablet once daily and Astelin (azelastine) 2 sprays per nostril 1-2 times daily as needed - You can use an extra dose of the antihistamine, if needed, for breakthrough symptoms.  - Consider nasal saline rinses 1-2 times daily to remove allergens from the nasal cavities as well as help with mucous clearance (this is especially helpful to do before the nasal sprays are given) - We will refer you to see Dr. Benjamine Mola for a  second opinion regarding the head pressure and headaches.   2. Return in about 3 months (around 11/24/2018).   Subjective:   Heather Fleming is a 67 y.o. female presenting today for evaluation of  Chief Complaint  Patient presents with  . Allergies  . Sinusitis  . Neck Pain  . Eye Drainage    Heather Fleming has a history of the following: Patient Active Problem List   Diagnosis Date Noted  . Non-allergic rhinitis 08/25/2018    History obtained from: chart review and patient.  Heather Fleming was referred by Sandi Mealy, MD.     Heather Fleming is a 67 y.o. female presenting for an evaluation of allergies. She has a history of chronic headaches. This current episode has been ongoing for 4 days. She has been to the ENT where it was shown that she has swollen and "inflamed sinuses". She is on a nasal steroid that she flushes through her nose. This was started around the spring 2019. She wen to see an ENT in [place]. She does report sinus congestion and pressure throughout the year. There were no plans to do any further interventions with regards to her sinus pressure. She did have a sinus CT scan in early 2019. Symptoms do seem to resolved during certain times of the year.   She was on allergy shots years ago, but her insurance was not covering it well and she stopped. In total, she was on shots for one year. Now she is on the  Flonase only and nasal saline rinses. She is on antihistamines (she has tried all of them and is currently on cetirizine). She is also on montelukast. She does report nose bleeds. She was seen in October 2019 and was placed on Augmentin, which she took only for a few days.   She does not have a history of asthma but she has had some coughing intermittently. She has no history of asthma or eczema. Otherwise, there is no history of other atopic diseases, including food allergies, drug allergies, stinging insect allergies or urticaria. There is no significant  infectious history. Vaccinations are up to date.    Past Medical History: Patient Active Problem List   Diagnosis Date Noted  . Non-allergic rhinitis 08/25/2018    Medication List:  Allergies as of 08/25/2018      Reactions   Augmentin [amoxicillin-pot Clavulanate] Nausea Only   Upset Stomach   Codeine Itching   Latex Rash      Medication List        Accurate as of 08/25/18  4:20 PM. Always use your most recent med list.          aspirin 81 MG chewable tablet Chew 81 mg by mouth daily.   atorvastatin 40 MG tablet Commonly known as:  LIPITOR Take 40 mg by mouth daily.   azelastine 0.1 % nasal spray Commonly known as:  ASTELIN Place 2 sprays into both nostrils 2 (two) times daily.   baclofen 20 MG tablet Commonly known as:  LIORESAL Take 20 mg by mouth daily.   cetirizine 10 MG tablet Commonly known as:  ZYRTEC Take 10 mg by mouth daily.   citalopram 20 MG tablet Commonly known as:  CELEXA Take 20 mg by mouth daily.   fluticasone 50 MCG/ACT nasal spray Commonly known as:  FLONASE Place 1 spray into both nostrils daily.   gabapentin 300 MG capsule Commonly known as:  NEURONTIN Take 300 mg by mouth 2 (two) times daily.   levocetirizine 5 MG tablet Commonly known as:  XYZAL Take 1 tablet (5 mg total) by mouth every evening.   losartan-hydrochlorothiazide 100-25 MG tablet Commonly known as:  HYZAAR Take 1 tablet by mouth daily.   montelukast 10 MG tablet Commonly known as:  SINGULAIR Take 10 mg by mouth at bedtime.   omeprazole 20 MG capsule Commonly known as:  PRILOSEC Take 20 mg by mouth daily.   potassium chloride 10 MEQ tablet Commonly known as:  K-DUR,KLOR-CON Take 10 mEq by mouth 2 (two) times daily.   propranolol 20 MG tablet Commonly known as:  INDERAL Take 20 mg by mouth 2 (two) times daily.   traMADol 50 MG tablet Commonly known as:  ULTRAM Take 50 mg by mouth every 6 (six) hours as needed.       Birth History:  non-contributory  Developmental History: non-contributory.   Past Surgical History: Past Surgical History:  Procedure Laterality Date  . CHOLECYSTECTOMY    . COLONOSCOPY    . COLONOSCOPY N/A 04/03/2016   Procedure: COLONOSCOPY;  Surgeon: Rogene Houston, MD;  Location: [facility];  Service: Endoscopy;  Laterality: N/A;  1000  . TONSILLECTOMY       Family History: Family History  Problem Relation Age of Onset  . Allergic rhinitis Mother   . Allergic rhinitis Father   . Breast cancer Neg Hx      Social History: Tomi lives at home with her family. She lives in a house that is 69 years old. There  is wood in the main living areas and tile in the bedrooms. They have gas heating and central cooling. There are no animals inside or outside of the home. They do not have dust mite coverings on the bedding. There is no tobacco exposure. She is currently retired.     Review of Systems: a 14-point review of systems is pertinent for what is mentioned in HPI.  Otherwise, all other systems were negative. Constitutional: negative other than that listed in the HPI Eyes: negative other than that listed in the HPI Ears, nose, mouth, throat, and face: negative other than that listed in the HPI Respiratory: negative other than that listed in the HPI Cardiovascular: negative other than that listed in the HPI Gastrointestinal: negative other than that listed in the HPI Genitourinary: negative other than that listed in the HPI Integument: negative other than that listed in the HPI Hematologic: negative other than that listed in the HPI Musculoskeletal: negative other than that listed in the HPI Neurological: negative other than that listed in the HPI Allergy/Immunologic: negative other than that listed in the HPI    Objective:   Blood pressure 120/60, pulse 69, temperature 98 F (36.7 C), temperature source Oral, resp. rate 16, height 5\' 4"  (1.626 m), weight 203 lb 12.8 oz (92.4 kg), SpO2  94 %. Body mass index is 34.98 kg/m.   Physical Exam:  General: Alert, interactive, in no acute distress. Pleasant and talkative.  Eyes: No conjunctival injection bilaterally, no discharge on the right, no discharge on the left and no Horner-Trantas dots present. PERRL bilaterally. EOMI without pain. No photophobia.  Ears: Right TM pearly gray with normal light reflex, Left TM pearly gray with normal light reflex, Right TM intact without perforation and Left TM intact without perforation.  Nose/Throat: External nose within normal limits and septum midline. Turbinates edematous with clear discharge. Posterior oropharynx erythematous without cobblestoning in the posterior oropharynx. Tonsils 2+ without exudates.  Tongue without thrush. Neck: Supple without thyromegaly. Trachea midline. Adenopathy: no enlarged lymph nodes appreciated in the anterior cervical, occipital, axillary, epitrochlear, inguinal, or popliteal regions. Lungs: Clear to auscultation without wheezing, rhonchi or rales. No increased work of breathing. CV: Normal S1/S2. No murmurs. Capillary refill <2 seconds.  Abdomen: Nondistended, nontender. No guarding or rebound tenderness. Bowel sounds faint and hypoactive  Skin: Warm and dry, without lesions or rashes. Extremities:  No clubbing, cyanosis or edema. Neuro:   Grossly intact. No focal deficits appreciated. Responsive to questions.  Diagnostic studies:   Allergy Studies:   Airborne Adult Perc - 09-22-2018 1433    Time Antigen Placed  1500    Allergen Manufacturer  Lavella Hammock    Location  Back    Number of Test  59    Panel 1  Select    1. Control-Buffer 50% Glycerol  Negative    2. Control-Histamine 1 mg/ml  2+    3. Albumin saline  Negative    4. [place]  Negative    5. [place]  Negative    6. Johnson  Negative    7. Fountain Blue  Negative    8. Meadow Fescue  Negative    9. Perennial Rye  Negative    10. Sweet Vernal  Negative    11. Timothy  Negative    12.  Cocklebur  Negative    13. Burweed Marshelder  Negative    14. Ragweed, short  Negative    15. Ragweed, Giant  Negative    16. Plantain,  English  Negative    17. Lamb's Quarters  Negative    18. Sheep Sorrell  Negative    19. Rough Pigweed  Negative    20. Marsh Elder, Rough  Negative    21. Mugwort, Common  Negative    22. Ash mix  Negative    23. Birch mix  Negative    24. Beech American  Negative    25. Box, Elder  Negative    26. Cedar, red  Negative    27. Cottonwood, [place]  Negative    28. Elm mix  Negative    29. Hickory mix  Negative    30. Maple mix  Negative    31. Oak, [place] mix  Negative    32. Pecan Pollen  Negative    33. Pine mix  Negative    34. Sycamore Eastern  Negative    35. Sentinel, Black Pollen  Negative    36. Alternaria alternata  Negative    37. Cladosporium Herbarum  Negative    38. Aspergillus mix  Negative    39. Penicillium mix  Negative    40. Bipolaris sorokiniana (Helminthosporium)  Negative    41. Drechslera spicifera (Curvularia)  Negative    42. Mucor plumbeus  Negative    43. Fusarium moniliforme  Negative    44. Aureobasidium pullulans (pullulara)  Negative    45. Rhizopus oryzae  Negative    46. Botrytis cinera  Negative    47. Epicoccum nigrum  Negative    48. Phoma betae  Negative    49. Candida Albicans  Negative    50. Trichophyton mentagrophytes  Negative    51. Mite, D Farinae  5,000 AU/ml  Negative    52. Mite, D Pteronyssinus  5,000 AU/ml  Negative    53. Cat Hair 10,000 BAU/ml  Negative    54.  Dog Epithelia  Negative    55. Mixed Feathers  Negative    56. Horse Epithelia  Negative    57. Cockroach, German  Negative    58. Mouse  Negative    59. Tobacco Leaf  Negative     Intradermal - 08/25/18 1506    Time Antigen Placed  1515    Allergen Manufacturer  Lavella Hammock    Location  Arm    Number of Test  15    Control  Negative    [place]  Negative    Johnson  Negative    7 Grass  Negative    Ragweed mix  Negative     Weed mix  Negative    Tree mix  Negative    Mold 1  Negative    Mold 2  Negative    Mold 3  Negative    Mold 4  Negative    Cat  Negative    Dog  Negative    Cockroach  Negative    Mite mix  Negative        Allergy testing results were read and interpreted by myself, documented by clinical staff.       Salvatore Marvel, MD [facility]

## 2018-08-25 NOTE — Patient Instructions (Addendum)
1. Chronic rhinitis - Testing today showed: negative to the entire panel - Copy of test results provided.  - We will confirm with blood testing.  - Stop taking: Zyrtec (cetirizine) - Continue with: Flonase (fluticasone) two sprays per nostril daily - Start taking: Xyzal (levocetirizine) 5mg  tablet once daily and Astelin (azelastine) 2 sprays per nostril 1-2 times daily as needed - You can use an extra dose of the antihistamine, if needed, for breakthrough symptoms.  - Consider nasal saline rinses 1-2 times daily to remove allergens from the nasal cavities as well as help with mucous clearance (this is especially helpful to do before the nasal sprays are given) - We will refer you to see Dr. Benjamine Mola for a second opinion regarding the head pressure and headaches.   2. Return in about 3 months (around 11/24/2018).   Please inform us of any Emergency Department visits, hospitalizations, or changes in symptoms. Call us before going to the ED for breathing or allergy symptoms since we might be able to fit you in for a sick visit. Feel free to contact us anytime with any questions, problems, or concerns.  It was a pleasure to meet you today!  Websites that have reliable patient information: 1. American Academy of Asthma, Allergy, and Immunology: www.aaaai.org 2. Food Allergy Research and Education (FARE): foodallergy.org 3. Mothers of Asthmatics: http://www.asthmacommunitynetwork.org 4. American College of Allergy, Asthma, and Immunology: MonthlyElectricBill.co.uk   Make sure you are registered to vote! If you have moved or changed any of your contact information, you will need to get this updated before voting!

## 2018-08-27 ENCOUNTER — Telehealth: Payer: Self-pay

## 2018-08-27 NOTE — Telephone Encounter (Signed)
Referral placed in proficient for Dr Velvet Bathe office.

## 2018-08-27 NOTE — Telephone Encounter (Signed)
-----   Message from Valentina Shaggy, MD sent at 08/25/2018  4:26 PM EST ----- ENT referral placed.

## 2018-10-04 ENCOUNTER — Ambulatory Visit
Admission: RE | Admit: 2018-10-04 | Discharge: 2018-10-04 | Disposition: A | Discharge status: Home / Self Care | Payer: Medicare Other | Source: Ambulatory Visit | Attending: Family Medicine | Admitting: Family Medicine

## 2018-10-04 DIAGNOSIS — Z1231 Encounter for screening mammogram for malignant neoplasm of breast: Secondary | ICD-10-CM

## 2019-01-27 ENCOUNTER — Other Ambulatory Visit: Payer: Self-pay | Admitting: Allergy & Immunology

## 2019-02-25 IMAGING — MG DIGITAL SCREENING BILATERAL MAMMOGRAM WITH TOMO AND CAD
8 series · 8 of 24 positions shown · non-contrast
Comparison: Previous exam(s).

CLINICAL DATA: Screening.

EXAM:
DIGITAL SCREENING BILATERAL MAMMOGRAM WITH TOMO AND CAD

[L CC synth-2D]
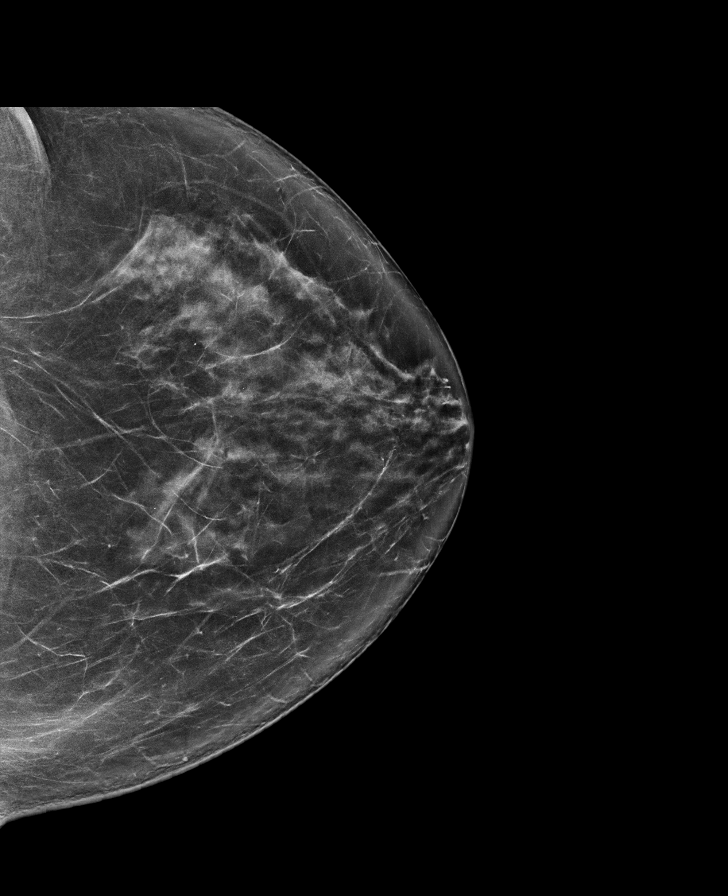

[R CC synth-2D]
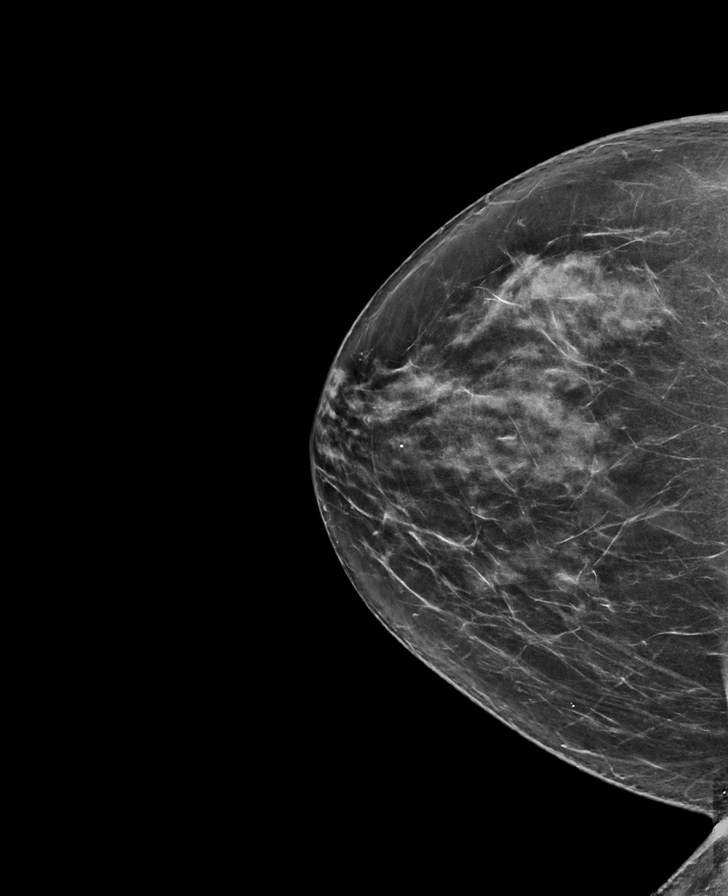

[R MLO synth-2D]
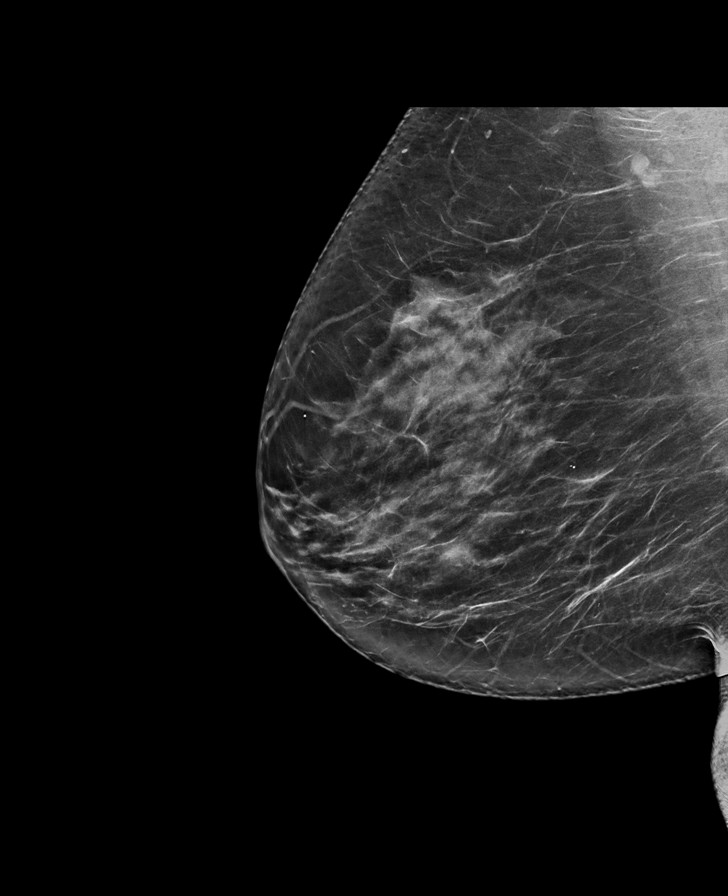

[L MLO synth-2D]
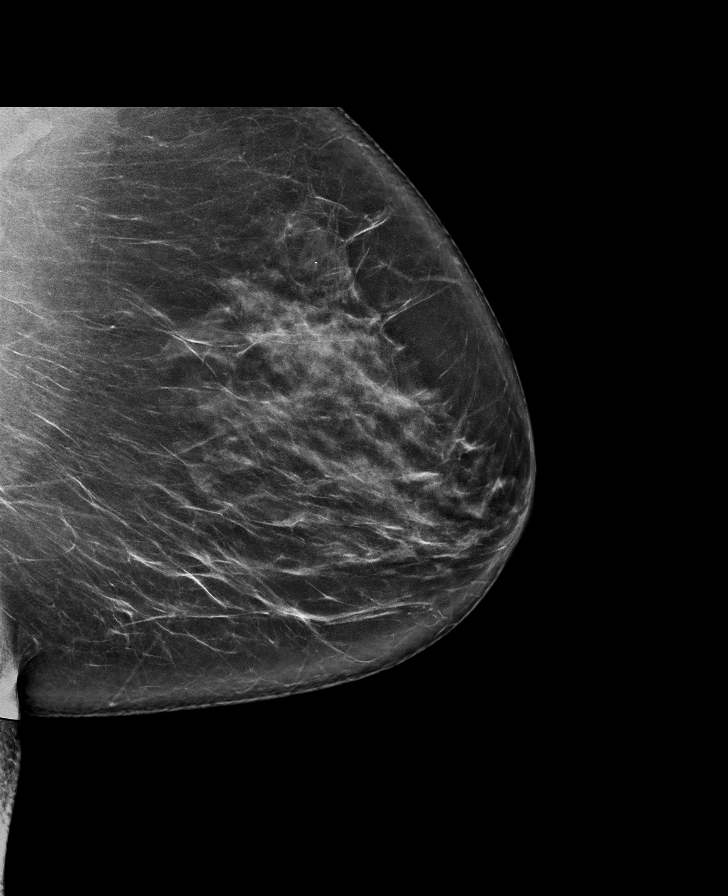

[L CC tomo · tomo slice 41/82.0]
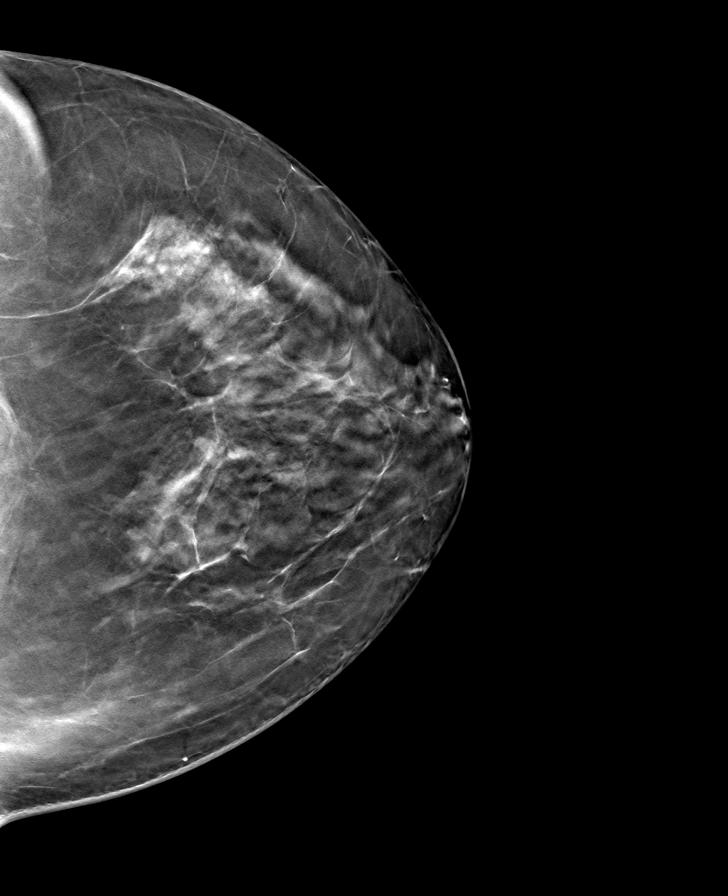

[R CC tomo · tomo slice 38/75.0]
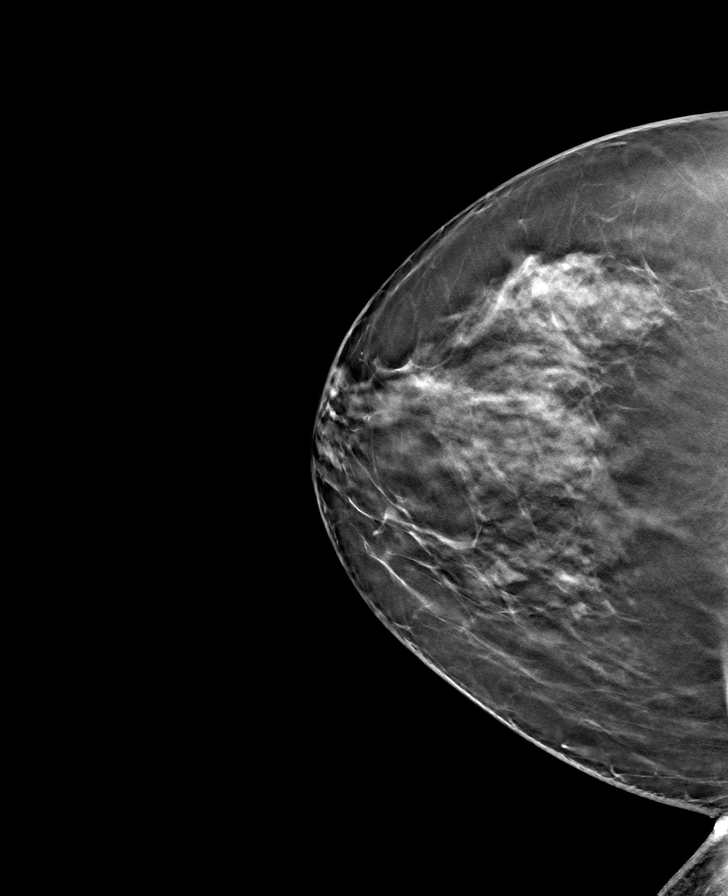

[L MLO tomo · tomo slice 44/87.0]
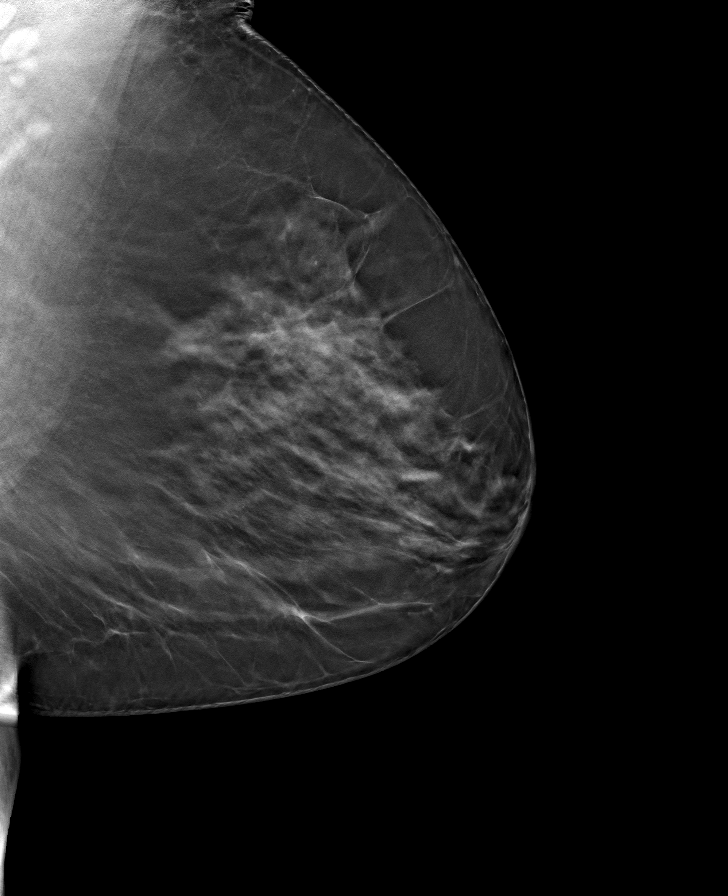

[R MLO tomo · tomo slice 41/80.0]
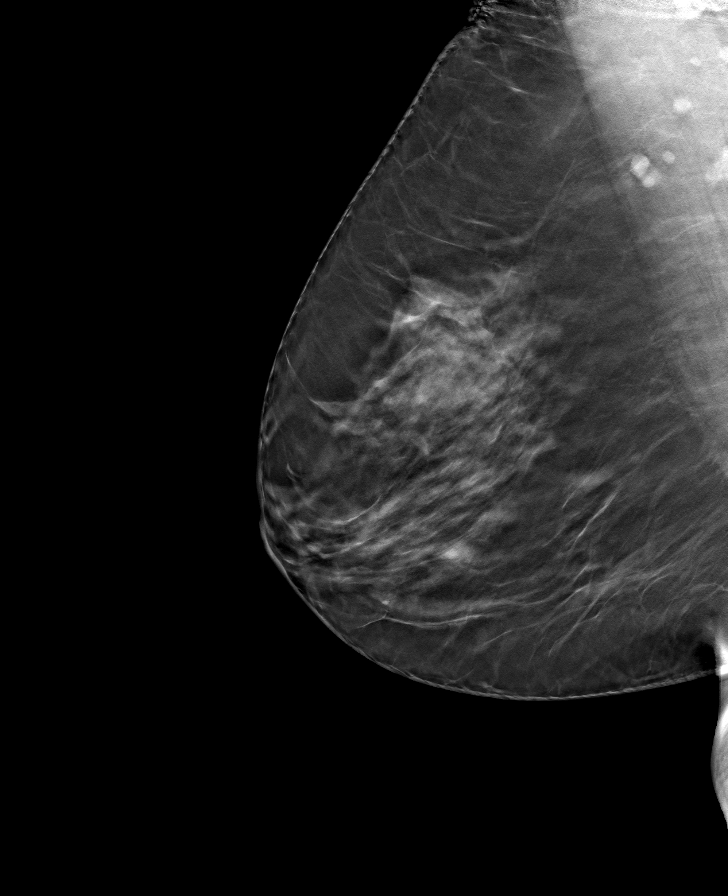

[8 of 24 positions shown; findings below may reference images not displayed]

ACR Breast Density Category c: The breast tissue is heterogeneously
dense, which may obscure small masses.
FINDINGS: There are no findings suspicious for malignancy. Images were
processed with CAD.
IMPRESSION: No mammographic evidence of malignancy. A result letter of this
screening mammogram will be mailed directly to the patient.

RECOMMENDATION:
Screening mammogram in one year. (Code:[id number]-U-LHB)

BI-RADS CATEGORY  1: Negative.

## 2019-03-14 ENCOUNTER — Other Ambulatory Visit: Payer: Self-pay | Admitting: Allergy & Immunology

## 2019-07-27 ENCOUNTER — Encounter: Payer: Self-pay | Admitting: Neurology

## 2019-07-27 ENCOUNTER — Other Ambulatory Visit: Payer: Self-pay

## 2019-07-27 ENCOUNTER — Ambulatory Visit (INDEPENDENT_AMBULATORY_CARE_PROVIDER_SITE_OTHER): Payer: Medicare Other | Admitting: Neurology

## 2019-07-27 VITALS — BP 149/74 | HR 77 | Temp 98.3°F | Ht 64.0 in | Wt 194.0 lb

## 2019-07-27 DIAGNOSIS — R0683 Snoring: Secondary | ICD-10-CM | POA: Diagnosis not present

## 2019-07-27 DIAGNOSIS — R519 Headache, unspecified: Secondary | ICD-10-CM

## 2019-07-27 DIAGNOSIS — R002 Palpitations: Secondary | ICD-10-CM | POA: Diagnosis not present

## 2019-07-27 DIAGNOSIS — R351 Nocturia: Secondary | ICD-10-CM | POA: Diagnosis not present

## 2019-07-27 DIAGNOSIS — E669 Obesity, unspecified: Secondary | ICD-10-CM | POA: Diagnosis not present

## 2019-07-27 NOTE — Progress Notes (Signed)
ldSubjective:    Patient ID: Heather Fleming is a 68 y.o. female.  HPI     Star Age, MD, PhD [facility] [address], [address] [place], [place] [address]  Dear Dr. Lorra Hals, I saw your patient, Heather Fleming, upon your kind request in my sleep clinic today for initial consultation of her sleep disorder, in particular, concern for underlying obstructive sleep apnea.  The patient is unaccompanied today.  As you know, Heather Fleming is a 68 year old right-handed woman with an underlying medical history of palpitations, hypertension, and obesity, who reports snoring and some sleep disruption.  She has had nocturnal palpitations, sometimes during the day.  She has not had any chest pain but has woken up with a sense of needing to breathe more deeply.  She has not had any witnessed apneas, her husband sleeps in a different bedroom or on the couch because of her loud snoring she reports.  When she had a colonoscopy she was encouraged to get tested for sleep apnea.  I reviewed your office records which you kindly included.  Her Epworth sleepiness score is 2 out of 24, fatigue severity score is 39 out of 63.  She lives with her husband, she is a non-smoker and drinks alcohol in the form of beer, 2-4 beers per week on average, caffeine in the form of coffee, about 1 to 2 cups/day on average.  She is retired.  Bedtime is generally around 11, she does not typically watch TV while in bed, rise time is around 9.  She has a history of neuropathy, it bothers her some nights more than others, she had seen Dr. Erling Cruz in the distant past for this and was started on amitriptyline, now she is on gabapentin but does not like to take it because of side effects including daytime grogginess and feeling of off balance.  She has nocturia about once per average night, has occasional morning headaches, not as bad now than in the past.  She is trying to lose weight, has lost some weight with  weight watchers.  Her father snored loudly and had suspected sleep apnea but was not formally tested.  She has had recurrent UTIs.  She has not seen a cardiologist for her palpitations.  Her Past Medical History Is Significant For: Past Medical History:  Diagnosis Date  . Depression   . GERD (gastroesophageal reflux disease)   . Hyperlipemia   . Hypertension   . UTI (urinary tract infection)     Her Past Surgical History Is Significant For: Past Surgical History:  Procedure Laterality Date  . CHOLECYSTECTOMY    . COLONOSCOPY    . COLONOSCOPY N/A 04/03/2016   Procedure: COLONOSCOPY;  Surgeon: Rogene Houston, MD;  [facility];  Service: Endoscopy;  Laterality: N/A;  1000  . TONSILLECTOMY      Her Family History Is Significant For: Family History  Problem Relation Age of Onset  . Allergic rhinitis Mother   . Allergic rhinitis Father   . Breast cancer Neg Hx     Her Social History Is Significant For: Social History   Socioeconomic History  . Marital status: Married    Spouse name: Not on file  . Number of children: Not on file  . Years of education: Not on file  . Highest education level: Not on file  Occupational History  . Not on file  Social Needs  . Financial resource strain: Not on file  . Food insecurity  Worry: Not on file    Inability: Not on file  . Transportation needs    Medical: Not on file    Non-medical: Not on file  Tobacco Use  . Smoking status: Never Smoker  . Smokeless tobacco: Never Used  Substance and Sexual Activity  . Alcohol use: Yes    Comment: occasionally  . Drug use: No  . Sexual activity: Not on file  Lifestyle  . Physical activity    Days per week: Not on file    Minutes per session: Not on file  . Stress: Not on file  Relationships  . Social Herbalist on phone: Not on file    Gets together: Not on file    Attends religious service: Not on file    Active member of club or organization: Not on file     Attends meetings of clubs or organizations: Not on file    Relationship status: Not on file  Other Topics Concern  . Not on file  Social History Narrative  . Not on file    Her Allergies Are:  Allergies  Allergen Reactions  . Augmentin [Amoxicillin-Pot Clavulanate] Nausea Only    Upset Stomach  . Codeine Itching  . Latex Rash  :   Her Current Medications Are:  Outpatient Encounter Medications as of 07/27/2019  Medication Sig  . aspirin 81 MG chewable tablet Chew 81 mg by mouth daily.  Marland Kitchen atorvastatin (LIPITOR) 40 MG tablet Take 40 mg by mouth daily.  . baclofen (LIORESAL) 20 MG tablet Take 20 mg by mouth daily.  . cetirizine (ZYRTEC) 10 MG tablet Take 10 mg by mouth daily.  . citalopram (CELEXA) 20 MG tablet Take 20 mg by mouth daily.  . fluticasone (FLONASE) 50 MCG/ACT nasal spray Place 1 spray into both nostrils daily.   Marland Kitchen gabapentin (NEURONTIN) 300 MG capsule Take 300 mg by mouth 2 (two) times daily.   Marland Kitchen levocetirizine (XYZAL) 5 MG tablet TAKE 1 TABLET BY MOUTH EVERY EVENING  . losartan-hydrochlorothiazide (HYZAAR) 100-25 MG tablet Take 1 tablet by mouth daily.  . montelukast (SINGULAIR) 10 MG tablet Take 10 mg by mouth at bedtime.  Marland Kitchen omeprazole (PRILOSEC) 20 MG capsule Take 20 mg by mouth daily.  . potassium chloride (K-DUR,KLOR-CON) 10 MEQ tablet Take 10 mEq by mouth 2 (two) times daily.  . propranolol (INDERAL) 20 MG tablet Take 20 mg by mouth 2 (two) times daily.  . traMADol (ULTRAM) 50 MG tablet Take 50 mg by mouth every 6 (six) hours as needed.  . [DISCONTINUED] azelastine (ASTELIN) 0.1 % nasal spray Place 2 sprays into both nostrils 2 (two) times daily.   No facility-administered encounter medications on file as of 07/27/2019.   :  Review of Systems:  Out of a complete 14 point review of systems, all are reviewed and negative with the exception of these symptoms as listed below: Review of Systems  Neurological:       Pt presents today to discuss her sleep. Pt has  never had a sleep study but does endorse snoring.  Epworth Sleepiness Scale 0= would never doze 1= slight chance of dozing 2= moderate chance of dozing 3= high chance of dozing  Sitting and reading: 1 Watching TV: 0 Sitting inactive in a public place (ex. Theater or meeting): 0 As a passenger in a car for an hour without a break: 1 Lying down to rest in the afternoon: 0 Sitting and talking to someone: 0 Sitting quietly after lunch (  no alcohol): 0 In a car, while stopped in traffic: 0 Total: 2     Objective:  Neurological Exam  Physical Exam Physical Examination:   Vitals:   07/27/19 1345  BP: (!) 149/74  Pulse: 77  Temp: 98.3 F (36.8 C)    General Examination: The patient is a very pleasant 68 y.o. female in no acute distress. She appears well-developed and well-nourished and well groomed.   HEENT: Normocephalic, atraumatic, pupils are equal, round and reactive to light,Extraocular tracking is well preserved, she has normal facial animation, hearing grossly intact, No nystagmus.  Airway examination reveals a small airway entry, status post tonsillectomy, somewhat wider uvula, Mallampati class II, neck circumference 15 and 7/8 inches.  She has a mild overbite.  Tongue protrudes centrally and palate elevates symmetrically.  No carotid bruits.   Chest: Clear to auscultation without wheezing, rhonchi or crackles noted.  Heart: S1+S2+0, regular and normal without murmurs, rubs or gallops noted.   Abdomen: Soft, non-tender and non-distended with normal bowel sounds appreciated on auscultation.  Extremities: There is no pitting edema in the distal lower extremities bilaterally. Pedal pulses are intact.  Skin: Warm and dry without trophic changes noted.  Musculoskeletal: exam reveals no obvious joint deformities, tenderness or joint swelling or erythema.   Neurologically:  Mental status: The patient is awake, alert and oriented in all 4 spheres. Her immediate and remote  memory, attention, language skills and fund of knowledge are appropriate. There is no evidence of aphasia, agnosia, apraxia or anomia. Speech is clear with normal prosody and enunciation. Thought process is linear. Mood is normal and affect is normal.  Cranial nerves II - XII are as described above under HEENT exam. In addition: shoulder shrug is normal with equal shoulder height noted. Motor exam: Normal bulk, strength and tone is noted. There is no tremor. Fine motor skills and coordination: intact grossly.   Cerebellar testing: No dysmetria or intention tremor on finger to nose testing. Heel to shin is unremarkable bilaterally. There is no truncal or gait ataxia.  Sensory exam: intact to light touch in the upper and lower extremities.  Gait, station and balance: She stands easily. No veering to one side is noted. No leaning to one side is noted. Posture is age-appropriate and stance is narrow based. Gait shows normal stride length and normal pace. No problems turning are noted.                Assessment and Plan:   In summary, Heather Fleming is a very pleasant 68 y.o.-year old female with an underlying medical history of palpitations, hypertension, and obesity, whose history and physical exam are concerning for obstructive sleep apnea (OSA). I had a long chat with the patient about my findings and the diagnosis of OSA, its prognosis and treatment options. We talked about medical treatments, surgical interventions and non-pharmacological approaches. I explained in particular the risks and ramifications of untreated moderate to severe OSA, especially with respect to developing cardiovascular disease down the Road, including congestive heart failure, difficult to treat hypertension, cardiac arrhythmias, or stroke. Even type 2 diabetes has, in part, been linked to untreated OSA. Symptoms of untreated OSA include daytime sleepiness, memory problems, mood irritability and mood disorder such as depression and  anxiety, lack of energy, as well as recurrent headaches, especially morning headaches. We talked about trying to maintain a healthy lifestyle in general, as well as the importance of weight control. We also talked about the importance of good sleep hygiene. I  recommended the following at this time: sleep study.  I explained the sleep test procedure to the patient and also outlined possible surgical and non-surgical treatment options of OSA, including the use of a custom-made dental device (which would require a referral to a specialist dentist or oral surgeon), upper airway surgical options, such as traditional UPPP or a novel less invasive surgical option in the form of Inspire hypoglossal nerve stimulation (which would involve a referral to an ENT surgeon). I also explained the CPAP treatment option to the patient, who indicated that she would be willing to try CPAP if the need arises. I explained the importance of being compliant with PAP treatment, not only for insurance purposes but primarily to improve Her symptoms, and for the patient's long term health benefit, including to reduce Her cardiovascular risks. I answered all her questions today and the patient was in agreement. I plan to see her back after the sleep study is completed and encouraged her to call with any interim questions, concerns, problems or updates.   Thank you very much for allowing me to participate in the care of this nice patient. If I can be of any further assistance to you please do not hesitate to call me at [phone].  Sincerely,   Star Age, MD, PhD

## 2019-07-27 NOTE — Patient Instructions (Signed)

## 2019-08-10 ENCOUNTER — Other Ambulatory Visit: Payer: Self-pay | Admitting: Allergy & Immunology

## 2019-11-08 ENCOUNTER — Other Ambulatory Visit: Payer: Self-pay | Admitting: Family Medicine

## 2019-11-08 DIAGNOSIS — Z1231 Encounter for screening mammogram for malignant neoplasm of breast: Secondary | ICD-10-CM

## 2019-11-30 ENCOUNTER — Other Ambulatory Visit: Payer: Self-pay

## 2019-11-30 ENCOUNTER — Ambulatory Visit
Admission: RE | Admit: 2019-11-30 | Discharge: 2019-11-30 | Disposition: A | Discharge status: Home / Self Care | Payer: Medicare Other | Source: Ambulatory Visit | Attending: Family Medicine | Admitting: Family Medicine

## 2019-11-30 DIAGNOSIS — Z1231 Encounter for screening mammogram for malignant neoplasm of breast: Secondary | ICD-10-CM

## 2020-01-11 ENCOUNTER — Encounter: Payer: Self-pay | Admitting: Orthopaedic Surgery

## 2020-01-11 ENCOUNTER — Other Ambulatory Visit: Payer: Self-pay

## 2020-01-11 ENCOUNTER — Ambulatory Visit (INDEPENDENT_AMBULATORY_CARE_PROVIDER_SITE_OTHER): Payer: Medicare Other | Admitting: Orthopaedic Surgery

## 2020-01-11 DIAGNOSIS — M79671 Pain in right foot: Secondary | ICD-10-CM | POA: Diagnosis not present

## 2020-01-11 DIAGNOSIS — M25551 Pain in right hip: Secondary | ICD-10-CM | POA: Diagnosis not present

## 2020-01-11 HISTORY — DX: Pain in right foot: M79.671

## 2020-01-11 MED ORDER — LIDOCAINE HCL 1 % IJ SOLN
2.0000 mL | INTRAMUSCULAR | Status: AC | PRN
  Administered 2020-01-11: 2 mL

## 2020-01-11 MED ORDER — BUPIVACAINE HCL 0.5 % IJ SOLN
2.0000 mL | INTRAMUSCULAR | Status: AC | PRN
Start: 2020-01-11 — End: 2020-01-11
  Administered 2020-01-11: 2 mL via INTRA_ARTICULAR

## 2020-01-11 MED ORDER — METHYLPREDNISOLONE ACETATE 40 MG/ML IJ SUSP
80.0000 mg | INTRAMUSCULAR | Status: AC | PRN
  Administered 2020-01-11: 80 mg via INTRA_ARTICULAR

## 2020-01-11 NOTE — Progress Notes (Signed)
Office Visit Note   Patient: Heather Fleming           Date of Birth: August 28, 1951           MRN: [id number] Visit Date: 01/11/2020              Requested by: Leeanne Rio, MD [place],  [place] [address] PCP: Leeanne Rio, MD   Assessment & Plan: Visit Diagnoses:  1. Pain in right foot   2. Pain in right hip     Plan: Heather Fleming has several issues.  The first is with her right great toe.  She "stump" her great toe in February 2020 and notes that it has been "bent ever since".  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  I urged her not to consider surgery and just wearing good comfortable shoes.  Barely is ever a problem.  The other issue is with pain over the greater trochanter of her right hip.  Sometimes will awaken her at night.  I did review films on the PACS system and she does have some arthritic changes in the lumbar spine but the hips appear to be clean.  Would be surprised if the pain is referred from the L5-S1 disc space but it could be that she has got bursitis so I will try a cortisone injection directly over the area of tenderness of the greater trochanter and monitor her response  Follow-Up Instructions: Return if symptoms worsen or fail to improve.   Orders:  Orders Placed This Encounter  Procedures  . Large Joint Inj: R greater trochanter   No orders of the defined types were placed in this encounter.     Procedures: Large Joint Inj: R greater trochanter on 01/11/2020 2:14 PM Indications: pain and diagnostic evaluation Details: 25 G 1.5 in needle  Arthrogram: No  Medications: 2 mL lidocaine 1 %; 2 mL bupivacaine 0.5 %; 80 mg methylPREDNISolone acetate 40 MG/ML Procedure, treatment alternatives, risks and benefits explained, specific risks discussed. Consent was given by the patient. Immediately prior to procedure a time out was called to verify the correct patient, procedure,  equipment, support staff and site/side marked as required. Patient was prepped and draped in the usual sterile fashion.       Clinical Data: No additional findings.   Subjective: Chief Complaint  Patient presents with  . Right Foot - Pain  Patient presents today for right hallux pain. She said that she stumped her great toe on a bed in February of 2020. She has noticed that since then her great toe has begun to curl. She said that it hurts rarely, but is tender if you push down on the joint. Her biggest concern today is that it "looks terrible". She had x-rays taken at [facility] and we are able to view those on the PACS system today.  I did review films of her great toe on the PACS system.  There are degenerative changes at the IP joint of the toe with slight volar subluxation of the distal phalanx.  No acute changes In addition Heather Fleming notes that she has had some pain over the lateral aspect of her right hip.  No injury or trauma.  She has had some back discomfort over the years.  Not having any groin pain.  Occasionally the pain will "run down the outside of my leg".  She has had some  difficulty at night.  I did review films of the lumbar spine in the PACS system and there are degenerative changes particularly at L4-5 and L5-S1.  At L5-S1 there is narrowing of the disc space with facet arthropathy at L4-5 and L5-S1.  There is minimal right-sided skin degenerative scoliosis.  Limited views of the hips did not demonstrate any obvious arthritis  HPI  Review of Systems   Objective: Vital Signs: Ht 5\' 4"  (1.626 m)   Wt 194 lb (88 kg)   BMI 33.30 kg/m   Physical Exam  Ortho Exam  Specialty Comments:  No specialty comments available.  Imaging: No results found.   PMFS History: Patient Active Problem List   Diagnosis Date Noted  . Pain in right foot 01/11/2020  . Pain in right hip 01/11/2020  . Non-allergic rhinitis 08/25/2018   Past Medical History:  Diagnosis  Date  . Depression   . GERD (gastroesophageal reflux disease)   . Hyperlipemia   . Hypertension   . UTI (urinary tract infection)     Family History  Problem Relation Age of Onset  . Allergic rhinitis Mother   . Allergic rhinitis Father   . Breast cancer Neg Hx     Past Surgical History:  Procedure Laterality Date  . CHOLECYSTECTOMY    . COLONOSCOPY    . COLONOSCOPY N/A 04/03/2016   Procedure: COLONOSCOPY;  Surgeon: Rogene Houston, MD;  Location[facility];  Service: Endoscopy;  Laterality: N/A;  1000  . TONSILLECTOMY     Social History   Occupational History  . Not on file  Tobacco Use  . Smoking status: Never Smoker  . Smokeless tobacco: Never Used  Substance and Sexual Activity  . Alcohol use: Yes    Comment: occasionally  . Drug use: No  . Sexual activity: Not on file

## 2020-06-14 ENCOUNTER — Encounter: Payer: Self-pay | Admitting: Neurology

## 2020-08-10 NOTE — Progress Notes (Signed)
NEUROLOGY CONSULTATION NOTE  Nandi Tonnesen MRN: [id number] DOB: 08-24-1951  Referring provider: Catalina Antigua, MD Primary care provider: Catalina Antigua, MD  Reason for consult:  headaches   Subjective:  Heather Fleming is a 69 year old right-handed female with HTN, HLD and IBS who presents for headaches.  History supplemented by PCP notes.  She has had headaches for decades.  In past, related to hormones and stress.  Now, she thinks it is related to the arthritis in her neck.  It is a a moderate-severe holocephalic throbbing head pressure with associated photophobia, phonophobia, and nausea.  On one occasion in August 2021, it was preceded by a visual aura of blue and pink waves in the vision of her left eye.  She had an MRI of the brain MRI of brain without contrast on 05/22/2020 showed scattered nonspecific hyperintense periventricular and subcortical white matter foci likely chronic small vessel ischemic changes.  They typically occur 3 to 4 days and occur once a month.  Current NSAIDS/analgesics:  ASA 81mg  daily, Tylenol Arthritis, naproxen (upsets stomach) Current triptans:  none Current ergotamine:  none Current anti-emetic:  none Current muscle relaxants:  Baclofen 20mg  Current Antihypertensive medications:  Propranolol 20mg  BID Current Antidepressant medications:  Celexa 20mg  daily Current Anticonvulsant medications:  Gabapentin 300mg  - 600mg  at bedtime (neuropathic pain) Current anti-CGRP:  none Current Vitamins/Herbal/Supplements:  none Current Antihistamines/Decongestants:  Xyzal, Flonase Other therapy:  none Hormone/birth control:  none   Past NSAIDS/analgesics:  Excedrin Migraine Past abortive triptans:  none Past abortive ergotamine:  none Past muscle relaxants:  tizanidine Past anti-emetic:  none Past antihypertensive medications:  none Past antidepressant medications:  Amitriptyline  Past anticonvulsant medications:  none Past anti-CGRP:   none Past vitamins/Herbal/Supplements:  none Past antihistamines/decongestants:  none Other past therapies:  none  Other pain:  Cervical DDD, stiff neck  PAST MEDICAL HISTORY: Past Medical History:  Diagnosis Date  . Depression   . GERD (gastroesophageal reflux disease)   . Hyperlipemia   . Hypertension   . UTI (urinary tract infection)     PAST SURGICAL HISTORY: Past Surgical History:  Procedure Laterality Date  . CHOLECYSTECTOMY    . COLONOSCOPY    . COLONOSCOPY N/A 04/03/2016   Procedure: COLONOSCOPY;  Surgeon: Rogene Houston, MD;  [facility];  Service: Endoscopy;  Laterality: N/A;  1000  . TONSILLECTOMY      MEDICATIONS: Current Outpatient Medications on File Prior to Visit  Medication Sig Dispense Refill  . aspirin 81 MG chewable tablet Chew 81 mg by mouth daily.    Marland Kitchen atorvastatin (LIPITOR) 40 MG tablet Take 40 mg by mouth daily.    . baclofen (LIORESAL) 20 MG tablet Take 20 mg by mouth daily.    . cetirizine (ZYRTEC) 10 MG tablet Take 10 mg by mouth daily.    . citalopram (CELEXA) 20 MG tablet Take 20 mg by mouth daily.    . fluticasone (FLONASE) 50 MCG/ACT nasal spray Place 1 spray into both nostrils daily.     Marland Kitchen gabapentin (NEURONTIN) 300 MG capsule Take 300 mg by mouth 2 (two) times daily.     Marland Kitchen levocetirizine (XYZAL) 5 MG tablet TAKE 1 TABLET BY MOUTH EVERY EVENING 30 tablet 4  . losartan-hydrochlorothiazide (HYZAAR) 100-25 MG tablet Take 1 tablet by mouth daily.    . montelukast (SINGULAIR) 10 MG tablet Take 10 mg by mouth at bedtime.    Marland Kitchen omeprazole (PRILOSEC) 20 MG capsule Take 20 mg by mouth daily.    Marland Kitchen  potassium chloride (K-DUR,KLOR-CON) 10 MEQ tablet Take 10 mEq by mouth 2 (two) times daily.    . propranolol (INDERAL) 20 MG tablet Take 20 mg by mouth 2 (two) times daily.    . traMADol (ULTRAM) 50 MG tablet Take 50 mg by mouth every 6 (six) hours as needed.     No current facility-administered medications on file prior to visit.     ALLERGIES: Allergies  Allergen Reactions  . Augmentin [Amoxicillin-Pot Clavulanate] Nausea Only    Upset Stomach  . Codeine Itching  . Latex Rash    FAMILY HISTORY: Family History  Problem Relation Age of Onset  . Allergic rhinitis Mother   . Allergic rhinitis Father   . Breast cancer Neg Hx     SOCIAL HISTORY: Social History   Socioeconomic History  . Marital status: Married    Spouse name: Not on file  . Number of children: Not on file  . Years of education: Not on file  . Highest education level: Not on file  Occupational History  . Not on file  Tobacco Use  . Smoking status: Never Smoker  . Smokeless tobacco: Never Used  Vaping Use  . Vaping Use: Never used  Substance and Sexual Activity  . Alcohol use: Yes    Comment: occasionally  . Drug use: No  . Sexual activity: Not on file  Other Topics Concern  . Not on file  Social History Narrative  . Not on file   Social Determinants of Health   Financial Resource Strain:   . Difficulty of Paying Living Expenses: Not on file  Food Insecurity:   . Worried About Charity fundraiser in the Last Year: Not on file  . Ran Out of Food in the Last Year: Not on file  Transportation Needs:   . Lack of Transportation (Medical): Not on file  . Lack of Transportation (Non-Medical): Not on file  Physical Activity:   . Days of Exercise per Week: Not on file  . Minutes of Exercise per Session: Not on file  Stress:   . Feeling of Stress : Not on file  Social Connections:   . Frequency of Communication with Friends and Family: Not on file  . Frequency of Social Gatherings with Friends and Family: Not on file  . Attends Religious Services: Not on file  . Active Member of Clubs or Organizations: Not on file  . Attends Archivist Meetings: Not on file  . Marital Status: Not on file  Intimate Partner Violence:   . Fear of Current or Ex-Partner: Not on file  . Emotionally Abused: Not on file  . Physically  Abused: Not on file  . Sexually Abused: Not on file    Objective:  Blood pressure (!) 145/83, pulse 75, height 5\' 4"  (1.626 m), weight 204 lb 3.2 oz (92.6 kg), SpO2 98 %. General: No acute distress.  Patient appears well-groomed.   Head:  Normocephalic/atraumatic Eyes:  fundi examined but not visualized Neck: supple, no paraspinal tenderness, full range of motion Back: No paraspinal tenderness Heart: regular rate and rhythm Lungs: Clear to auscultation bilaterally. Vascular: No carotid bruits. Neurological Exam: Mental status: alert and oriented to person, place, and time, recent and remote memory intact, fund of knowledge intact, attention and concentration intact, speech fluent and not dysarthric, language intact. Cranial nerves: CN I: not tested CN II: pupils equal, round and reactive to light, visual fields intact CN III, IV, VI:  full range of motion, no nystagmus,  no ptosis CN V: facial sensation intact. CN VII: upper and lower face symmetric CN VIII: hearing intact CN IX, X: gag intact, uvula midline CN XI: sternocleidomastoid and trapezius muscles intact CN XII: tongue midline Bulk & Tone: normal, no fasciculations. Motor:  muscle strength 5/5 throughout Sensation:  Pinprick and vibratory sensation reduced in feet. Deep Tendon Reflexes:  1+ throughout,  toes downgoing.   Finger to nose testing:  Without dysmetria.   Heel to shin:  Without dysmetria.   Gait:  Normal station and stride.  Romberg negative.  Assessment/Plan:   1.  Migraine without aura, with status migrainosus, intractable 2.  Migraine with aura 3.  Cervical degenerative disc disease  1.  Increase propranolol to 40mg  twice daily.  If headaches not improved in 6 weeks, contact me 2.  Aleve or Tylenol as needed.  Limit use of pain relievers to no more than 2 days out of week to prevent risk of rebound or medication-overuse headache. 3.  Check X-ray of cervical spine.  Will likely refer to physical therapy  for neck pain. 4.  Keep headache diary 5.  Follow up in 6 months.    Thank you for allowing me to take part in the care of this patient.  [name], DO  CC: Catalina Antigua, MD

## 2020-08-13 ENCOUNTER — Ambulatory Visit (INDEPENDENT_AMBULATORY_CARE_PROVIDER_SITE_OTHER): Payer: Medicare Other | Admitting: Neurology

## 2020-08-13 ENCOUNTER — Other Ambulatory Visit: Payer: Self-pay

## 2020-08-13 ENCOUNTER — Ambulatory Visit
Admission: RE | Admit: 2020-08-13 | Discharge: 2020-08-13 | Disposition: A | Discharge status: Home / Self Care | Payer: Medicare Other | Source: Ambulatory Visit | Attending: Neurology | Admitting: Neurology

## 2020-08-13 ENCOUNTER — Other Ambulatory Visit: Payer: Self-pay | Admitting: Neurology

## 2020-08-13 ENCOUNTER — Encounter: Payer: Self-pay | Admitting: Neurology

## 2020-08-13 VITALS — BP 145/83 | HR 75 | Ht 64.0 in | Wt 204.2 lb

## 2020-08-13 DIAGNOSIS — G43109 Migraine with aura, not intractable, without status migrainosus: Secondary | ICD-10-CM

## 2020-08-13 DIAGNOSIS — M5030 Other cervical disc degeneration, unspecified cervical region: Secondary | ICD-10-CM

## 2020-08-13 DIAGNOSIS — G43011 Migraine without aura, intractable, with status migrainosus: Secondary | ICD-10-CM | POA: Diagnosis not present

## 2020-08-13 MED ORDER — PROPRANOLOL HCL 40 MG PO TABS: 40.0000 mg | ORAL_TABLET | Freq: Two times a day (BID) | ORAL | 5 refills | Status: DC

## 2020-08-13 NOTE — Patient Instructions (Addendum)
  1. Increase propranolol to 40mg  twice daily.  Contact me in 6 weeks if no improvement. 2. Take naproxen or Tylenol at earliest onset of headache.  May repeat dose once in 2 hours if needed.  Maximum 2 tablets in 24 hours. 3. Will check X-ray of cervical spine.  Will likely refer you to physical therapy afterwards. 4. Limit use of pain relievers to no more than 2 days out of the week.  These medications include acetaminophen, NSAIDs (ibuprofen/Advil/Motrin, naproxen/Aleve, triptans (Imitrex/sumatriptan), Excedrin, and narcotics.  This will help reduce risk of rebound headaches. 5. Be aware of common food triggers:  - Caffeine:  coffee, black tea, cola, Mt. Dew  - Chocolate  - Dairy:  aged cheeses (brie, blue, cheddar, gouda, [place], provolone, [place], Swiss, etc), chocolate milk, buttermilk, sour cream, limit eggs and yogurt  - Nuts, peanut butter  - Alcohol  - Cereals/grains:  FRESH breads (fresh bagels, sourdough, doughnuts), yeast productions  - Processed/canned/aged/cured meats (pre-packaged deli meats, hotdogs)  - MSG/glutamate:  soy sauce, flavor enhancer, pickled/preserved/marinated foods  - Sweeteners:  aspartame (Equal, Nutrasweet).  Sugar and Splenda are okay  - Vegetables:  legumes (lima beans, lentils, snow peas, fava beans, pinto peans, peas, garbanzo beans), sauerkraut, onions, olives, pickles  - Fruit:  avocados, bananas, citrus fruit (orange, lemon, grapefruit), mango  - Other:  Frozen meals, macaroni and cheese 6. Routine exercise 7. Stay adequately hydrated (aim for 64 oz water daily) 8. Keep headache diary 9. Maintain proper stress management 10. Maintain proper sleep hygiene 11. Do not skip meals 12. Consider supplements:  magnesium citrate 400mg  daily, riboflavin 400mg  daily, coenzyme Q10 100mg  three times daily.  Ask your insurance about the following medications: Aimovig Emgality Ajovy Qulipta Nurtec 75mg  every other day.

## 2020-08-15 ENCOUNTER — Telehealth: Payer: Self-pay

## 2020-08-15 DIAGNOSIS — M5030 Other cervical disc degeneration, unspecified cervical region: Secondary | ICD-10-CM

## 2020-08-15 NOTE — Progress Notes (Signed)
Pt advised of her X-Ray and Pt referral

## 2020-08-15 NOTE — Telephone Encounter (Signed)
LMOVM, to call back. Referral added

## 2020-08-15 NOTE — Telephone Encounter (Signed)
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  Would refer to physical therapy for neck pain/cervical spondylosis

## 2021-01-10 ENCOUNTER — Other Ambulatory Visit: Payer: Self-pay

## 2021-01-10 MED ORDER — PROPRANOLOL HCL 40 MG PO TABS: 40.0000 mg | ORAL_TABLET | Freq: Two times a day (BID) | ORAL | 0 refills | Status: DC

## 2021-01-10 NOTE — Progress Notes (Signed)
Fax recevied from [facility]: Propranolol need a refill

## 2021-02-07 ENCOUNTER — Other Ambulatory Visit: Payer: Self-pay | Admitting: Family Medicine

## 2021-02-07 DIAGNOSIS — Z1231 Encounter for screening mammogram for malignant neoplasm of breast: Secondary | ICD-10-CM

## 2021-02-13 NOTE — Progress Notes (Signed)
NEUROLOGY FOLLOW UP OFFICE NOTE  Heather Fleming [id number]  Assessment/Plan:   1.  Migraine without aura 2.  Migraine with aura 3.  Cervical degenerative disc disease  1.  Continue neck exercises 2.  Continue propranolol 40mg  BID for now.  If still doing well at follow up, will discuss discontinuation 3.  Follow up 6 months.  Subjective:  Heather Fleming is a 70 year old right-handed female with HTN, HLD and IBS who follows up for migraines.  UPDATE: Increased propranolol in November.  Cervical plain films on 08/13/2020 personally reviewed showed moderate degenerative cervical spondylosis with disc and facet disease at C4-5, C5-6 and C6-7.  Referred to physical therapy.  Headaches improved.  She has not really had a headache.  As soon as she feels the headache, she does the neck exercises which abates them.  Current NSAIDS/analgesics:  ASA 81mg  daily, Tylenol Arthritis, naproxen (upsets stomach) Current triptans:  none Current ergotamine:  none Current anti-emetic:  none Current muscle relaxants:  Baclofen 20mg  Current Antihypertensive medications:  Propranolol 40mg  BID Current Antidepressant medications:  Celexa 20mg  daily Current Anticonvulsant medications:  Gabapentin 300mg  - 600mg  at bedtime (neuropathic pain) Current anti-CGRP:  none Current Vitamins/Herbal/Supplements:  none Current Antihistamines/Decongestants:  Xyzal, Flonase Other therapy:  none Hormone/birth control:  none  Other pain:  Cervical DDD, stiff neck  HISTORY: She has had headaches for decades.  In past, related to hormones and stress.  Now, she thinks it is related to the arthritis in her neck.  It is a a moderate-severe holocephalic throbbing head pressure with associated photophobia, phonophobia, and nausea.  On one occasion in August 2021, it was preceded by a visual aura of blue and pink waves in the vision of her left eye.  She had an MRI of the brain MRI of brain without contrast on  05/22/2020 showed scattered nonspecific hyperintense periventricular and subcortical white matter foci likely chronic small vessel ischemic changes.  They typically occur 3 to 4 days and occur once a month.   Past NSAIDS/analgesics:  Excedrin Migraine Past abortive triptans:  none Past abortive ergotamine:  none Past muscle relaxants:  tizanidine Past anti-emetic:  none Past antihypertensive medications:  none Past antidepressant medications:  Amitriptyline  Past anticonvulsant medications:  none Past anti-CGRP:  none Past vitamins/Herbal/Supplements:  none Past antihistamines/decongestants:  none Other past therapies:  none    PAST MEDICAL HISTORY: Past Medical History:  Diagnosis Date  . Depression   . GERD (gastroesophageal reflux disease)   . Hyperlipemia   . Hypertension   . UTI (urinary tract infection)     MEDICATIONS: Current Outpatient Medications on File Prior to Visit  Medication Sig Dispense Refill  . aspirin 81 MG chewable tablet Chew 81 mg by mouth daily.    Marland Kitchen atorvastatin (LIPITOR) 40 MG tablet Take 40 mg by mouth daily.    . baclofen (LIORESAL) 20 MG tablet Take 20 mg by mouth daily.    . cetirizine (ZYRTEC) 10 MG tablet Take 10 mg by mouth daily. (Patient not taking: Reported on 08/13/2020)    . citalopram (CELEXA) 20 MG tablet Take 20 mg by mouth daily.    . fluticasone (FLONASE) 50 MCG/ACT nasal spray Place 1 spray into both nostrils daily.     Marland Kitchen gabapentin (NEURONTIN) 300 MG capsule Take 300 mg by mouth 2 (two) times daily.     Marland Kitchen levocetirizine (XYZAL) 5 MG tablet TAKE 1 TABLET BY MOUTH EVERY EVENING 30 tablet 4  . losartan-hydrochlorothiazide (HYZAAR)  100-25 MG tablet Take 1 tablet by mouth daily.    . montelukast (SINGULAIR) 10 MG tablet Take 10 mg by mouth at bedtime.    Marland Kitchen omeprazole (PRILOSEC) 20 MG capsule Take 20 mg by mouth daily.    . potassium chloride (K-DUR,KLOR-CON) 10 MEQ tablet Take 10 mEq by mouth 2 (two) times daily.    . propranolol  (INDERAL) 40 MG tablet Take 1 tablet (40 mg total) by mouth 2 (two) times daily. 60 tablet 0  . traMADol (ULTRAM) 50 MG tablet Take 50 mg by mouth every 6 (six) hours as needed. (Patient not taking: Reported on 08/13/2020)     No current facility-administered medications on file prior to visit.    ALLERGIES: Allergies  Allergen Reactions  . Augmentin [Amoxicillin-Pot Clavulanate] Nausea Only    Upset Stomach  . Codeine Itching  . Latex Rash    FAMILY HISTORY: Family History  Problem Relation Age of Onset  . Allergic rhinitis Mother   . Allergic rhinitis Father   . Breast cancer Neg Hx       Objective:  Blood pressure (!) 153/81, pulse 89, resp. rate 20, height 5\' 4"  (1.626 m), weight 209 lb (94.8 kg), SpO2 94 %. General: No acute distress.  Patient appears well-groomed.     Heather Clines, DO  CC: Heather Rio, MD

## 2021-02-14 ENCOUNTER — Encounter: Payer: Self-pay | Admitting: Neurology

## 2021-02-14 ENCOUNTER — Other Ambulatory Visit: Payer: Self-pay

## 2021-02-14 ENCOUNTER — Ambulatory Visit
Admission: RE | Admit: 2021-02-14 | Discharge: 2021-02-14 | Disposition: A | Discharge status: Home / Self Care | Payer: Medicare Other | Source: Ambulatory Visit | Attending: Family Medicine | Admitting: Family Medicine

## 2021-02-14 ENCOUNTER — Ambulatory Visit: Payer: Medicare Other | Admitting: Neurology

## 2021-02-14 VITALS — BP 153/81 | HR 89 | Resp 20 | Ht 64.0 in | Wt 209.0 lb

## 2021-02-14 DIAGNOSIS — G43109 Migraine with aura, not intractable, without status migrainosus: Secondary | ICD-10-CM | POA: Diagnosis not present

## 2021-02-14 DIAGNOSIS — Z1231 Encounter for screening mammogram for malignant neoplasm of breast: Secondary | ICD-10-CM

## 2021-02-14 DIAGNOSIS — M5030 Other cervical disc degeneration, unspecified cervical region: Secondary | ICD-10-CM

## 2021-02-14 NOTE — Patient Instructions (Signed)
Continue neck exercises Continue propranolol for now.  If still doing well next time, we can discuss stopping it

## 2021-02-26 ENCOUNTER — Other Ambulatory Visit: Payer: Self-pay | Admitting: Neurology

## 2021-04-05 ENCOUNTER — Ambulatory Visit: Payer: Medicare Other

## 2021-04-09 ENCOUNTER — Ambulatory Visit: Payer: Medicare Other

## 2021-04-11 ENCOUNTER — Telehealth: Payer: Self-pay | Admitting: Neurology

## 2021-04-11 MED ORDER — PROPRANOLOL HCL 40 MG PO TABS: 40.0000 mg | ORAL_TABLET | Freq: Two times a day (BID) | ORAL | 1 refills | Status: DC

## 2021-04-11 NOTE — Telephone Encounter (Signed)
Pt called in and left a message with the access nurse stating she needs a refill of her propranolol 40 mg. Please send to [facility] order pharmacy.

## 2021-04-11 NOTE — Telephone Encounter (Signed)
Refill of propranolol 40 mg to [facility] as requested.

## 2021-05-20 ENCOUNTER — Telehealth: Payer: Self-pay | Admitting: Neurology

## 2021-05-20 NOTE — Telephone Encounter (Signed)
Per pt she had a migraine for four days now, she taking her Propranolol, Aleve to no relief.  Pt states she had some nausea. Pt states she schedule an appt with the chiropractor for Wednesday. Unsure if it is related to her neck pan of not.     Wants to know if she could have something called in today?

## 2021-05-20 NOTE — Telephone Encounter (Signed)
Pt stated she has had a migraine for 4 days, and nothing seems to be helping. She would like a call to discuss her options

## 2021-05-21 MED ORDER — PREDNISONE 10 MG (21) PO TBPK: ORAL_TABLET | ORAL | 0 refills | Status: DC

## 2021-05-21 NOTE — Telephone Encounter (Signed)
For an ongoing headache for 4 days, we can prescribe her a prednisone taper.  Otherwise, if she has a driver, she can come in for a headache cocktail (but as it is now the end of the day, she will need to come in tomorrow).   Prednisone taper ordered.

## 2021-08-07 ENCOUNTER — Other Ambulatory Visit: Payer: Self-pay | Admitting: Neurology

## 2021-08-27 NOTE — Progress Notes (Deleted)
NEUROLOGY FOLLOW UP OFFICE NOTE  Heather Fleming [id number]  Assessment/Plan:   Migraine without aura, without status migrainosus, not intractable Migraine with aura, without status migrainosus, not intractable Degenerative disc disease/spondylosis of cervical spine  Migraine prevention:  *** Migraine rescue:  *** Limit use of pain relievers to no more than 2 days out of week to prevent risk of rebound or medication-overuse headache. Keep headache diary Follow up ***   Subjective:   Important   Pulse 89 Resp 20 Ht 5\' 4"  (1.626 m) Wt 209 lb (94.8 kg) SpO2 94% BMI 35.87 kg/m BSA 2.07 m   Flowsheets:  Vital Signs, NEWS, MEWS Score, Anthropometrics, Falls, Healthcare Directives, Method of Visit   Encounter Info:  Billing Info, History, Allergies, Detailed Report    All Notes   Progress Notes by Pieter Partridge, DO at 02/14/2021 2:30 PM  Author: Pieter Partridge, DO Author Type: Physician Filed: 02/14/2021  2:35 PM  Note Status: Signed Cosign: Cosign Not Required Encounter Date: 02/14/2021  Editor: Pieter Partridge, DO (Physician)                  NEUROLOGY FOLLOW UP OFFICE NOTE   Heather Fleming [id number]   Assessment/Plan:    1.  Migraine without aura 2.  Migraine with aura 3.  Cervical degenerative disc disease   1.  Continue neck exercises 2.  Continue propranolol 40mg  BID for now.  If still doing well at follow up, will discuss discontinuation 3.  Follow up 6 months.   Subjective:  Heather Fleming is a 70 year old right-handed female with HTN, HLD and IBS who follows up for migraines.   UPDATE: Prescribed prednisone taper in late August for intractable headache ongoing for 4 days.   ***  Current NSAIDS/analgesics:  ASA 81mg  daily, Tylenol Arthritis, naproxen (upsets stomach) Current triptans:  none Current ergotamine:  none Current anti-emetic:  none Current muscle relaxants:  Baclofen 20mg  Current Antihypertensive  medications:  Propranolol 40mg  BID Current Antidepressant medications:  Celexa 20mg  daily Current Anticonvulsant medications:  Gabapentin 300mg  - 600mg  at bedtime (neuropathic pain) Current anti-CGRP:  none Current Vitamins/Herbal/Supplements:  none Current Antihistamines/Decongestants:  Xyzal, Flonase Other therapy:  none Hormone/birth control:  none   Other pain:  Cervical DDD, stiff neck   HISTORY:  She has had headaches for decades.  In past, related to hormones and stress.  Now, she thinks it is related to the arthritis in her neck.  It is a a moderate-severe holocephalic throbbing head pressure with associated photophobia, phonophobia, and nausea.  On one occasion in August 2021, it was preceded by a visual aura of blue and pink waves in the vision of her left eye.  She had an MRI of the brain MRI of brain without contrast on 05/22/2020 showed scattered nonspecific hyperintense periventricular and subcortical white matter foci likely chronic small vessel ischemic changes.  They typically occur 3 to 4 days and occur once a month.     Past NSAIDS/analgesics:  Excedrin Migraine Past abortive triptans:  none Past abortive ergotamine:  none Past muscle relaxants:  tizanidine Past anti-emetic:  none Past antihypertensive medications:  none Past antidepressant medications:  Amitriptyline  Past anticonvulsant medications:  none Past anti-CGRP:  none Past vitamins/Herbal/Supplements:  none Past antihistamines/decongestants:  none Other past therapies:  none        PAST MEDICAL HISTORY: Past Medical History:  Diagnosis Date   Depression    GERD (gastroesophageal reflux disease)  Hyperlipemia    Hypertension    UTI (urinary tract infection)     MEDICATIONS: Current Outpatient Medications on File Prior to Visit  Medication Sig Dispense Refill   aspirin 81 MG chewable tablet Chew 81 mg by mouth daily.     atorvastatin (LIPITOR) 40 MG tablet Take 40 mg by mouth daily.      baclofen (LIORESAL) 20 MG tablet Take 20 mg by mouth daily.     cetirizine (ZYRTEC) 10 MG tablet Take 10 mg by mouth daily.     citalopram (CELEXA) 20 MG tablet Take 20 mg by mouth daily.     fluticasone (FLONASE) 50 MCG/ACT nasal spray Place 1 spray into both nostrils daily.      gabapentin (NEURONTIN) 300 MG capsule Take 300 mg by mouth 3 (three) times daily.     levocetirizine (XYZAL) 5 MG tablet TAKE 1 TABLET BY MOUTH EVERY EVENING 30 tablet 4   losartan-hydrochlorothiazide (HYZAAR) 100-25 MG tablet Take 1 tablet by mouth daily.     montelukast (SINGULAIR) 10 MG tablet Take 10 mg by mouth at bedtime.     omeprazole (PRILOSEC) 20 MG capsule Take 20 mg by mouth daily.     potassium chloride (K-DUR,KLOR-CON) 10 MEQ tablet Take 10 mEq by mouth 2 (two) times daily.     predniSONE (STERAPRED UNI-PAK 21 TAB) 10 MG (21) TBPK tablet take 60mg  day 1, then 50mg  day 2, then 40mg  day 3, then 30mg  day 4, then 20mg  day 5, then 10mg  day 6, then STOP 21 tablet 0   propranolol (INDERAL) 40 MG tablet TAKE 1 TABLET BY MOUTH  TWICE DAILY 60 tablet 0   traMADol (ULTRAM) 50 MG tablet Take 50 mg by mouth every 6 (six) hours as needed. (Patient not taking: Reported on 02/14/2021)     No current facility-administered medications on file prior to visit.    ALLERGIES: Allergies  Allergen Reactions   Augmentin [Amoxicillin-Pot Clavulanate] Nausea Only    Upset Stomach   Codeine Itching   Azithromycin Nausea Only   Latex Rash    FAMILY HISTORY: Family History  Problem Relation Age of Onset   Allergic rhinitis Mother    Allergic rhinitis Father    Breast cancer Neg Hx       Objective:  *** General: No acute distress.  Patient appears ***-groomed.   Head:  Normocephalic/atraumatic Eyes:  Fundi examined but not visualized Neck: supple, no paraspinal tenderness, full range of motion Heart:  Regular rate and rhythm Lungs:  Clear to auscultation bilaterally Back: No paraspinal tenderness Neurological  Exam: alert and oriented to person, place, and time.  Speech fluent and not dysarthric, language intact.  CN II-XII intact. Bulk and tone normal, muscle strength 5/5 throughout.  Sensation to light touch intact.  Deep tendon reflexes 2+ throughout, toes downgoing.  Finger to nose testing intact.  Gait normal, Romberg negative.   [name], DO  CC: ***

## 2021-08-28 ENCOUNTER — Ambulatory Visit: Payer: Medicare Other | Admitting: Neurology

## 2021-09-20 ENCOUNTER — Other Ambulatory Visit: Payer: Self-pay | Admitting: Neurology

## 2021-11-22 ENCOUNTER — Ambulatory Visit: Payer: Medicare Other | Admitting: Neurology

## 2021-12-03 ENCOUNTER — Other Ambulatory Visit: Payer: Self-pay | Admitting: Neurology

## 2021-12-05 NOTE — Progress Notes (Signed)
? ?NEUROLOGY FOLLOW UP OFFICE NOTE ? ?Heather Fleming ?[id number] ? ?Assessment/Plan:  ? ?1.  Migraine without aura, without status migrainosus, not intractable ?2.  Migraine with aura, without status migrainosus, not intractable ?3.  Cervical degenerative disc disease ?4.  Elevated blood pressure ?  ?1.  discontinue propranolol.  If headaches return, will restart ?2.  Follow up with PCP regarding blood pressre ?3.  Follow up 6 months. ?  ?Subjective:  ?Heather Fleming is a 71 year old right-handed female with HTN, HLD and IBS who follows up for migraines. ?  ?UPDATE: ?Over the summer, she had an intractable headache.  Otherwise, no real headaches. Interested in stopping propranolol ?  ?Current NSAIDS/analgesics:  ASA '81mg'$  daily, Tylenol Arthritis, naproxen (upsets stomach) ?Current triptans:  none ?Current ergotamine:  none ?Current anti-emetic:  none ?Current muscle relaxants:  Baclofen '20mg'$  ?Current Antihypertensive medications:  Propranolol '40mg'$  BID ?Current Antidepressant medications:  Celexa '20mg'$  daily ?Current Anticonvulsant medications:  Gabapentin '300mg'$  - '600mg'$  at bedtime (neuropathic pain) ?Current anti-CGRP:  none ?Current Vitamins/Herbal/Supplements:  none ?Current Antihistamines/Decongestants:  Xyzal, Flonase ?Other therapy:  home neck exercises ?Hormone/birth control:  none ?  ?Other pain:  Cervical DDD, stiff neck ?  ?HISTORY:  ?She has had headaches for decades.  In past, related to hormones and stress.  Now, she thinks it is related to the arthritis in her neck.  Cervical plain films on 08/13/2020 showed moderate degenerative cervical spondylosis with disc and facet disease at C4-5, C5-6 and C6-7.It is a a moderate-severe holocephalic throbbing head pressure with associated photophobia, phonophobia, and nausea.  On one occasion in August 2021, it was preceded by a visual aura of blue and pink waves in the vision of her left eye.  She had an MRI of the brain MRI of brain without contrast  on 05/22/2020 showed scattered nonspecific hyperintense periventricular and subcortical white matter foci likely chronic small vessel ischemic changes.  They typically occur 3 to 4 days and occur once a month. ?  ?  ?Past NSAIDS/analgesics:  Excedrin Migraine ?Past abortive triptans:  none ?Past abortive ergotamine:  none ?Past muscle relaxants:  tizanidine ?Past anti-emetic:  none ?Past antihypertensive medications:  none ?Past antidepressant medications:  Amitriptyline  ?Past anticonvulsant medications:  none ?Past anti-CGRP:  none ?Past vitamins/Herbal/Supplements:  none ?Past antihistamines/decongestants:  none ?Other past therapies:  physical therapy ? ?PAST MEDICAL HISTORY: ?Past Medical History:  ?Diagnosis Date  ? Depression   ? GERD (gastroesophageal reflux disease)   ? Hyperlipemia   ? Hypertension   ? UTI (urinary tract infection)   ? ? ?MEDICATIONS: ?Current Outpatient Medications on File Prior to Visit  ?Medication Sig Dispense Refill  ? aspirin 81 MG chewable tablet Chew 81 mg by mouth daily.    ? atorvastatin (LIPITOR) 40 MG tablet Take 40 mg by mouth daily.    ? baclofen (LIORESAL) 20 MG tablet Take 20 mg by mouth daily.    ? cetirizine (ZYRTEC) 10 MG tablet Take 10 mg by mouth daily.    ? citalopram (CELEXA) 20 MG tablet Take 20 mg by mouth daily.    ? fluticasone (FLONASE) 50 MCG/ACT nasal spray Place 1 spray into both nostrils daily.     ? gabapentin (NEURONTIN) 300 MG capsule Take 300 mg by mouth 3 (three) times daily.    ? levocetirizine (XYZAL) 5 MG tablet TAKE 1 TABLET BY MOUTH EVERY EVENING 30 tablet 4  ? losartan-hydrochlorothiazide (HYZAAR) 100-25 MG tablet Take 1 tablet by mouth daily.    ?  montelukast (SINGULAIR) 10 MG tablet Take 10 mg by mouth at bedtime.    ? omeprazole (PRILOSEC) 20 MG capsule Take 20 mg by mouth daily.    ? potassium chloride (K-DUR,KLOR-CON) 10 MEQ tablet Take 10 mEq by mouth 2 (two) times daily.    ? predniSONE (STERAPRED UNI-PAK 21 TAB) 10 MG (21) TBPK tablet take  '60mg'$  day 1, then '50mg'$  day 2, then '40mg'$  day 3, then '30mg'$  day 4, then '20mg'$  day 5, then '10mg'$  day 6, then STOP 21 tablet 0  ? propranolol (INDERAL) 40 MG tablet TAKE 1 TABLET BY MOUTH TWICE  DAILY 60 tablet 2  ? traMADol (ULTRAM) 50 MG tablet Take 50 mg by mouth every 6 (six) hours as needed. (Patient not taking: Reported on 02/14/2021)    ? ?No current facility-administered medications on file prior to visit.  ? ? ?ALLERGIES: ?Allergies  ?Allergen Reactions  ? Augmentin [Amoxicillin-Pot Clavulanate] Nausea Only  ?  Upset Stomach  ? Codeine Itching  ? Azithromycin Nausea Only  ? Latex Rash  ? ? ?FAMILY HISTORY: ?Family History  ?Problem Relation Age of Onset  ? Allergic rhinitis Mother   ? Allergic rhinitis Father   ? Breast cancer Neg Hx   ? ? ?  ?Objective:  ?Blood pressure (!) 161/83, pulse 70, height '5\' 4"'$  (1.626 m), weight 202 lb 12.8 oz (92 kg), SpO2 97 %. ?General: No acute distress.  Patient appears well-groomed.   ?Head:  Normocephalic/atraumatic ?Eyes:  Fundi examined but not visualized ? ?Neurological Exam: alert and oriented to person, place, and time.  Speech fluent and not dysarthric, language intact.  CN II-XII intact. Bulk and tone normal, muscle strength 5/5 throughout.  Sensation to pinprick and vibratory sensation reduced in feet up to mid-shins  Deep tendon reflexes 2+ throughout, toes downgoing.  Finger to nose testing intact.  Gait broad-based.  Romberg with mild sway ? ? ?[name], DO ? ?CC: Catalina Antigua, MD ? ? ? ? ? ? ?

## 2021-12-06 ENCOUNTER — Encounter: Payer: Self-pay | Admitting: Neurology

## 2021-12-06 ENCOUNTER — Other Ambulatory Visit: Payer: Self-pay

## 2021-12-06 ENCOUNTER — Ambulatory Visit: Payer: Medicare Other | Admitting: Neurology

## 2021-12-06 VITALS — BP 161/83 | HR 70 | Ht 64.0 in | Wt 202.8 lb

## 2021-12-06 DIAGNOSIS — M5030 Other cervical disc degeneration, unspecified cervical region: Secondary | ICD-10-CM | POA: Diagnosis not present

## 2021-12-06 DIAGNOSIS — G43109 Migraine with aura, not intractable, without status migrainosus: Secondary | ICD-10-CM

## 2021-12-06 DIAGNOSIS — R030 Elevated blood-pressure reading, without diagnosis of hypertension: Secondary | ICD-10-CM | POA: Diagnosis not present

## 2021-12-06 DIAGNOSIS — G609 Hereditary and idiopathic neuropathy, unspecified: Secondary | ICD-10-CM | POA: Diagnosis not present

## 2021-12-06 NOTE — Patient Instructions (Addendum)
Try stopping propranolol.  If headaches start up, restart it ?Follow up with PCP regarding blood pressure ?Follow up in 6 months. ?

## 2021-12-16 ENCOUNTER — Telehealth: Payer: Self-pay | Admitting: Neurology

## 2021-12-16 NOTE — Telephone Encounter (Signed)
She also has been very irritable and was curious if that might be due to the change in medication. ?

## 2021-12-16 NOTE — Telephone Encounter (Signed)
Pt called in stating she had stopped propranolol, but her headaches are coming back with all of the stress she is under right now. She has decided she is going to start taking it again. ?

## 2021-12-17 NOTE — Telephone Encounter (Signed)
Per pt she had chest pain on Saturday that lasted about minute or two. And the mood swing are bad. Please advise. ?

## 2021-12-18 NOTE — Telephone Encounter (Signed)
Tried calling pt back no answer.LMOVM. ?OK to restart propranolol '40mg'$  twice daily.  Would recommend that she contact her PCP regarding chest pain.  ? ?

## 2021-12-24 NOTE — Telephone Encounter (Signed)
Telephone call to pt, Did she get our message to start Propranolol 40 BID. ?Per Pt she heard the message wrong she been doing 40 mg Once a day. ? ?Sent pt a Therapist, music with Dr.jaffe recommendation. ? ?Pt will check it when she returns. Had a death in the family and on her way out of town right. ?

## 2022-01-27 ENCOUNTER — Other Ambulatory Visit: Payer: Self-pay | Admitting: Family Medicine

## 2022-01-27 DIAGNOSIS — Z1231 Encounter for screening mammogram for malignant neoplasm of breast: Secondary | ICD-10-CM

## 2022-02-11 ENCOUNTER — Ambulatory Visit: Payer: Medicare Other | Admitting: Dermatology

## 2022-03-04 ENCOUNTER — Ambulatory Visit
Admission: RE | Admit: 2022-03-04 | Discharge: 2022-03-04 | Disposition: A | Discharge status: Home / Self Care | Payer: Medicare Other | Source: Ambulatory Visit | Attending: Family Medicine | Admitting: Family Medicine

## 2022-03-04 DIAGNOSIS — Z1231 Encounter for screening mammogram for malignant neoplasm of breast: Secondary | ICD-10-CM

## 2022-03-09 ENCOUNTER — Other Ambulatory Visit: Payer: Self-pay | Admitting: Neurology

## 2022-05-07 ENCOUNTER — Other Ambulatory Visit: Payer: Self-pay | Admitting: Neurology

## 2022-06-09 NOTE — Progress Notes (Unsigned)
NEUROLOGY FOLLOW UP OFFICE NOTE  Heather Fleming [id number]  Assessment/Plan:   1.  Migraine without aura, without status migrainosus, not intractable 2.  Migraine with aura,, without status migrainosus, not intractable 3.  Cervical degenerative disc disease 4.  Elevated blood pressure   1.  discontinue propranolol.  If headaches return, will restart 2.  Follow up with PCP regarding blood pressre 3.  Follow up 6 months.   Subjective:  Heather Fleming is a 71 year old right-handed female with HTN, HLD and IBS who follows up for migraines.   UPDATE: She tried stopping propranolol in March but headaches returned, so it was restarted. ***  Current NSAIDS/analgesics:  ASA '81mg'$  daily, Tylenol Arthritis, naproxen (upsets stomach) Current triptans:  none Current ergotamine:  none Current anti-emetic:  none Current muscle relaxants:  Baclofen '20mg'$  Current Antihypertensive medications:  Propranolol '40mg'$  BID Current Antidepressant medications:  Celexa '20mg'$  daily Current Anticonvulsant medications:  Gabapentin '300mg'$  - '600mg'$  at bedtime (neuropathic pain) Current anti-CGRP:  none Current Vitamins/Herbal/Supplements:  none Current Antihistamines/Decongestants:  Xyzal, Flonase Other therapy:  home neck exercises Hormone/birth control:  none   Other pain:  Cervical DDD, stiff neck   HISTORY:  She has had headaches for decades.  In past, related to hormones and stress.  Now, she thinks it is related to the arthritis in her neck.  Cervical plain films on 08/13/2020 showed moderate degenerative cervical spondylosis with disc and facet disease at C4-5, C5-6 and C6-7.It is a a moderate-severe holocephalic throbbing head pressure with associated photophobia, phonophobia, and nausea.  On one occasion in August 2021, it was preceded by a visual aura of blue and pink waves in the vision of her left eye.  She had an MRI of the brain MRI of brain without contrast on 05/22/2020 showed  scattered nonspecific hyperintense periventricular and subcortical white matter foci likely chronic small vessel ischemic changes.  They typically occur 3 to 4 days and occur once a month.     Past NSAIDS/analgesics:  Excedrin Migraine Past abortive triptans:  none Past abortive ergotamine:  none Past muscle relaxants:  tizanidine Past anti-emetic:  none Past antihypertensive medications:  none Past antidepressant medications:  Amitriptyline  Past anticonvulsant medications:  none Past anti-CGRP:  none Past vitamins/Herbal/Supplements:  none Past antihistamines/decongestants:  none Other past therapies:  physical therapy  PAST MEDICAL HISTORY: Past Medical History:  Diagnosis Date   Depression    GERD (gastroesophageal reflux disease)    Hyperlipemia    Hypertension    UTI (urinary tract infection)     MEDICATIONS: Current Outpatient Medications on File Prior to Visit  Medication Sig Dispense Refill   aspirin 81 MG chewable tablet Chew 81 mg by mouth daily.     atorvastatin (LIPITOR) 40 MG tablet Take 40 mg by mouth daily.     baclofen (LIORESAL) 20 MG tablet Take 20 mg by mouth daily.     cetirizine (ZYRTEC) 10 MG tablet Take 10 mg by mouth daily.     citalopram (CELEXA) 20 MG tablet Take 20 mg by mouth daily.     fluticasone (FLONASE) 50 MCG/ACT nasal spray Place 1 spray into both nostrils daily.      gabapentin (NEURONTIN) 300 MG capsule Take 300 mg by mouth 2 (two) times daily.     levocetirizine (XYZAL) 5 MG tablet TAKE 1 TABLET BY MOUTH EVERY EVENING 30 tablet 4   losartan-hydrochlorothiazide (HYZAAR) 100-25 MG tablet Take 1 tablet by mouth daily.     montelukast (  SINGULAIR) 10 MG tablet Take 10 mg by mouth at bedtime.     omeprazole (PRILOSEC) 20 MG capsule Take 20 mg by mouth daily.     potassium chloride (K-DUR,KLOR-CON) 10 MEQ tablet Take 10 mEq by mouth 2 (two) times daily.     propranolol (INDERAL) 40 MG tablet TAKE 1 TABLET BY MOUTH TWICE  DAILY 60 tablet 0    No current facility-administered medications on file prior to visit.    ALLERGIES: Allergies  Allergen Reactions   Augmentin [Amoxicillin-Pot Clavulanate] Nausea Only    Upset Stomach   Codeine Itching   Azithromycin Nausea Only   Latex Rash    FAMILY HISTORY: Family History  Problem Relation Age of Onset   Allergic rhinitis Mother    Allergic rhinitis Father    Breast cancer Neg Hx       Objective:  *** General: No acute distress.  Patient appears well-groomed.   Head:  Normocephalic/atraumatic Eyes:  Fundi examined but not visualized  Neurological Exam: alert and oriented to person, place, and time.  Speech fluent and not dysarthric, language intact.  CN II-XII intact. Bulk and tone normal, muscle strength 5/5 throughout.  Sensation to pinprick and vibratory sensation reduced in feet up to mid-shins  Deep tendon reflexes 2+ throughout, toes downgoing.  Finger to nose testing intact.  Gait broad-based.  Romberg with mild sway   [name], DO  CC: Catalina Antigua, MD

## 2022-06-10 ENCOUNTER — Ambulatory Visit: Payer: Medicare Other | Admitting: Neurology

## 2022-06-10 ENCOUNTER — Encounter: Payer: Self-pay | Admitting: Neurology

## 2022-06-10 VITALS — BP 123/72 | HR 64 | Ht 64.0 in | Wt 193.8 lb

## 2022-06-10 DIAGNOSIS — G43009 Migraine without aura, not intractable, without status migrainosus: Secondary | ICD-10-CM

## 2022-06-10 DIAGNOSIS — M5030 Other cervical disc degeneration, unspecified cervical region: Secondary | ICD-10-CM | POA: Diagnosis not present

## 2022-06-10 DIAGNOSIS — G43109 Migraine with aura, not intractable, without status migrainosus: Secondary | ICD-10-CM | POA: Diagnosis not present

## 2022-06-10 MED ORDER — PROPRANOLOL HCL 40 MG PO TABS: 40.0000 mg | ORAL_TABLET | Freq: Two times a day (BID) | ORAL | 3 refills | Status: DC

## 2022-06-10 NOTE — Patient Instructions (Signed)
Propranolol '40mg'$  twice daily Follow up one year

## 2022-07-30 ENCOUNTER — Telehealth: Payer: Self-pay | Admitting: Neurology

## 2022-07-30 NOTE — Telephone Encounter (Signed)
Per Patient she has neuropathy in feet and noticed that she been having numbness in her leg above the ankle to her calf. She tried to rub it out but no success, Walking and exercise hasn't helped at all.  The numbness started  yesterday, Patient states she had her feet up in a recliner.

## 2022-07-30 NOTE — Telephone Encounter (Signed)
Pt called in stating her legs are getting numb especially her left one. She is getting worried and would like some advice.

## 2022-07-30 NOTE — Telephone Encounter (Signed)
Patient advised, Per Dr.Jaffe, This is a new problem so I need to see her in office

## 2022-07-31 NOTE — Progress Notes (Signed)
NEUROLOGY FOLLOW UP OFFICE NOTE  Heather Fleming [id number]  Assessment/Plan:   Idiopathic polyneuropathy - likely aggravating symptoms related to UTI  Will check labs to evaluate for other possible causes of neuropathy:  ANA, RF, IFE, ACE Further recommendations pending results.  Otherwise, follow up in September 2024 as scheduled.  Subjective:  Heather Fleming is a 71 year old right-handed female with HTN, HLD and IBS whom I see for migraines presents today for numbness.    She reports history of neuropathy since 2017. She did see neurology at that time.  Labs at that time included Hgb A1c 6.2, sed rate 15, negative RPR, TSH 3.125, and B12 265 . A NCV/EMG was ordered but never scheduled.  She never followed up with the neurologist.  She typically has numbness in the feet.  When laying down, she may get pins and needles sensation radiating up the shins.  Earlier this week, she got up and felt a crick in her back and had urinary incontinence.  A day or two later, she was sitting in the recliner with her legs up and applying Frankincense oil on her legs when she developed numbness travelling up her legs to the mid thighs.  It lasted into the next day.    She was diagnosed and prescribed Keflex for a UTI.     Current medications include:  gabapentin '300mg'$ -'600mg'$  at bedtime.  Also takes citalopram '20mg'$  daily. Past medications:  amitriptyline  PAST MEDICAL HISTORY: Past Medical History:  Diagnosis Date   Depression    GERD (gastroesophageal reflux disease)    Hyperlipemia    Hypertension    UTI (urinary tract infection)     MEDICATIONS: Current Outpatient Medications on File Prior to Visit  Medication Sig Dispense Refill   aspirin 81 MG chewable tablet Chew 81 mg by mouth daily.     atorvastatin (LIPITOR) 40 MG tablet Take 40 mg by mouth daily.     baclofen (LIORESAL) 20 MG tablet Take 20 mg by mouth daily. '10mg'$  every other night     calcium carbonate (OS-CAL) 1250 (500  Ca) MG chewable tablet Chew 1 tablet by mouth daily.     cetirizine (ZYRTEC) 10 MG tablet Take 10 mg by mouth daily.     citalopram (CELEXA) 20 MG tablet Take 20 mg by mouth daily.     Cranberry-Vitamin C-Probiotic (AZO CRANBERRY PO) Take by mouth.     D-MANNOSE PO Take by mouth. For bladder     fluticasone (FLONASE) 50 MCG/ACT nasal spray Place 1 spray into both nostrils daily.      gabapentin (NEURONTIN) 300 MG capsule Take 300 mg by mouth 2 (two) times daily.     levocetirizine (XYZAL) 5 MG tablet TAKE 1 TABLET BY MOUTH EVERY EVENING 30 tablet 4   losartan-hydrochlorothiazide (HYZAAR) 100-25 MG tablet Take 1 tablet by mouth daily.     montelukast (SINGULAIR) 10 MG tablet Take 10 mg by mouth at bedtime.     Multiple Vitamins-Minerals (HAIR SKIN AND NAILS FORMULA PO) Take by mouth.     NON FORMULARY healthy feet and nerves     Omega-3 Fatty Acids (FISH OIL OMEGA-3 PO) Take by mouth.     omeprazole (PRILOSEC) 20 MG capsule Take 20 mg by mouth daily.     potassium chloride (K-DUR,KLOR-CON) 10 MEQ tablet Take 10 mEq by mouth 2 (two) times daily.     propranolol (INDERAL) 40 MG tablet Take 1 tablet (40 mg total) by mouth 2 (two) times  daily. 180 tablet 3   saccharomyces boulardii (FLORASTOR) 250 MG capsule Take 250 mg by mouth daily.     No current facility-administered medications on file prior to visit.    ALLERGIES: Allergies  Allergen Reactions   Augmentin [Amoxicillin-Pot Clavulanate] Nausea Only    Upset Stomach   Codeine Itching   Azithromycin Nausea Only   Latex Rash    FAMILY HISTORY: Family History  Problem Relation Age of Onset   Allergic rhinitis Mother    Allergic rhinitis Father    Breast cancer Neg Hx       Objective:  Blood pressure 125/84, pulse 66, height '5\' 4"'$  (1.626 m), weight 195 lb 12.8 oz (88.8 kg), SpO2 97 %. General: No acute distress.  Patient appears well-groomed.   Head:  Normocephalic/atraumatic Eyes:  Fundi examined but not visualized Neck:  supple, no paraspinal tenderness, full range of motion Heart:  Regular rate and rhythm Lungs:  Clear to auscultation bilaterally Back: No paraspinal tenderness Neurological Exam: alert and oriented to person, place, and time.  Speech fluent and not dysarthric, language intact.  CN II-XII intact. Bulk and tone normal, muscle strength 5/5 throughout.  Sensation to pinprick reduced in toes up to mid shins, vibratory sensation slightly reduced.  Deep tendon reflexes 1+ throughout, toes downgoing.  Finger to nose testing intact.  Gait normal, Romberg negative.   [name], DO

## 2022-08-01 ENCOUNTER — Other Ambulatory Visit (INDEPENDENT_AMBULATORY_CARE_PROVIDER_SITE_OTHER): Payer: Medicare Other

## 2022-08-01 ENCOUNTER — Ambulatory Visit: Payer: Medicare Other | Admitting: Neurology

## 2022-08-01 ENCOUNTER — Encounter: Payer: Self-pay | Admitting: Neurology

## 2022-08-01 VITALS — BP 125/84 | HR 66 | Ht 64.0 in | Wt 195.8 lb

## 2022-08-01 DIAGNOSIS — G609 Hereditary and idiopathic neuropathy, unspecified: Secondary | ICD-10-CM

## 2022-08-01 NOTE — Patient Instructions (Addendum)
Will look for other causes of neuropathy:  ANA with reflex, RF, ACE, IFE.  Further recommendations pending results.

## 2022-08-05 LAB — ANA+ENA+DNA/DS+SCL 70+SJOSSA/B
ANA Titer 1: NEGATIVE
ENA RNP Ab: <0.2 AI (ref 0.0–0.9)
ENA SM Ab Ser-aCnc: <0.2 AI (ref 0.0–0.9)
ENA SSA (RO) Ab: <0.2 AI (ref 0.0–0.9)
ENA SSB (LA) Ab: <0.2 AI (ref 0.0–0.9)
Scleroderma (Scl-70) (ENA) Antibody, IgG: <0.2 AI (ref 0.0–0.9)
dsDNA Ab: <1 IU/mL (ref 0–9)

## 2022-08-07 LAB — IMMUNOFIXATION ELECTROPHORESIS
IgG (Immunoglobin G), Serum: 1085 mg/dL (ref 600–1540)
IgM, Serum: 65 mg/dL (ref 50–300)
Immunoglobulin A: 176 mg/dL (ref 70–320)

## 2022-08-07 LAB — ANGIOTENSIN CONVERTING ENZYME: Angiotensin-Converting Enzyme: 40 U/L (ref 9–67)

## 2022-08-07 LAB — RHEUMATOID FACTOR: Rheumatoid fact SerPl-aCnc: <14 IU/mL (ref <14)

## 2022-08-29 ENCOUNTER — Encounter: Payer: Self-pay | Admitting: Family Medicine

## 2022-08-29 ENCOUNTER — Ambulatory Visit (INDEPENDENT_AMBULATORY_CARE_PROVIDER_SITE_OTHER): Payer: Medicare Other | Admitting: Family Medicine

## 2022-08-29 VITALS — BP 134/74 | HR 64 | Ht 63.5 in | Wt 193.6 lb

## 2022-08-29 DIAGNOSIS — K219 Gastro-esophageal reflux disease without esophagitis: Secondary | ICD-10-CM | POA: Insufficient documentation

## 2022-08-29 DIAGNOSIS — G609 Hereditary and idiopathic neuropathy, unspecified: Secondary | ICD-10-CM | POA: Diagnosis not present

## 2022-08-29 DIAGNOSIS — G43809 Other migraine, not intractable, without status migrainosus: Secondary | ICD-10-CM | POA: Diagnosis not present

## 2022-08-29 DIAGNOSIS — Z86010 Personal history of colonic polyps: Secondary | ICD-10-CM | POA: Insufficient documentation

## 2022-08-29 DIAGNOSIS — N390 Urinary tract infection, site not specified: Secondary | ICD-10-CM | POA: Diagnosis not present

## 2022-08-29 DIAGNOSIS — E785 Hyperlipidemia, unspecified: Secondary | ICD-10-CM | POA: Insufficient documentation

## 2022-08-29 DIAGNOSIS — Z860101 Personal history of adenomatous and serrated colon polyps: Secondary | ICD-10-CM | POA: Insufficient documentation

## 2022-08-29 DIAGNOSIS — F419 Anxiety disorder, unspecified: Secondary | ICD-10-CM | POA: Diagnosis not present

## 2022-08-29 DIAGNOSIS — I10 Essential (primary) hypertension: Secondary | ICD-10-CM | POA: Insufficient documentation

## 2022-08-29 DIAGNOSIS — G43909 Migraine, unspecified, not intractable, without status migrainosus: Secondary | ICD-10-CM | POA: Insufficient documentation

## 2022-08-29 LAB — POCT URINALYSIS DIP (CLINITEK)
Spec Grav, UA: 1.02 (ref 1.010–1.025)
pH, UA: 6 (ref 5.0–8.0)

## 2022-08-29 MED ORDER — NITROFURANTOIN MONOHYD MACRO 100 MG PO CAPS: 100.0000 mg | ORAL_CAPSULE | Freq: Two times a day (BID) | ORAL | 0 refills | Status: DC

## 2022-08-29 MED ORDER — PREGABALIN 75 MG PO CAPS: 75.0000 mg | ORAL_CAPSULE | Freq: Two times a day (BID) | ORAL | 1 refills | Status: DC

## 2022-08-29 NOTE — Assessment & Plan Note (Signed)
Continue Celexa.  We will continue to monitor.

## 2022-08-29 NOTE — Assessment & Plan Note (Signed)
UA with moderate leukocytes today.  Sending culture.  Placing on Macrobid.  Referring to urology per patient request.

## 2022-08-29 NOTE — Assessment & Plan Note (Signed)
Well-controlled.  Continue losartan/HCTZ.

## 2022-08-29 NOTE — Assessment & Plan Note (Signed)
Stopping gabapentin due to side effects.  Starting Lyrica.  I sent a message to patient's neurologist and he is agreeable to this.

## 2022-08-29 NOTE — Patient Instructions (Signed)
Antibiotic as prescribed.  Continue your medications.  I will reach out to Dr. Tomi Likens.  Follow up in 6 months
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
## 2022-08-29 NOTE — Assessment & Plan Note (Signed)
Stable on Inderal.  Continue.

## 2022-09-01 LAB — URINE CULTURE

## 2022-10-07 DIAGNOSIS — M9902 Segmental and somatic dysfunction of thoracic region: Secondary | ICD-10-CM | POA: Diagnosis not present

## 2022-10-07 DIAGNOSIS — S233XXA Sprain of ligaments of thoracic spine, initial encounter: Secondary | ICD-10-CM | POA: Diagnosis not present

## 2022-10-07 DIAGNOSIS — S338XXA Sprain of other parts of lumbar spine and pelvis, initial encounter: Secondary | ICD-10-CM | POA: Diagnosis not present

## 2022-10-07 DIAGNOSIS — S134XXA Sprain of ligaments of cervical spine, initial encounter: Secondary | ICD-10-CM | POA: Diagnosis not present

## 2022-10-07 DIAGNOSIS — M9903 Segmental and somatic dysfunction of lumbar region: Secondary | ICD-10-CM | POA: Diagnosis not present

## 2022-10-07 DIAGNOSIS — M9901 Segmental and somatic dysfunction of cervical region: Secondary | ICD-10-CM | POA: Diagnosis not present

## 2022-10-08 ENCOUNTER — Ambulatory Visit: Payer: Medicare Other | Admitting: Urology

## 2022-10-08 ENCOUNTER — Encounter: Payer: Self-pay | Admitting: Urology

## 2022-10-08 VITALS — BP 132/77 | HR 77

## 2022-10-08 DIAGNOSIS — N390 Urinary tract infection, site not specified: Secondary | ICD-10-CM

## 2022-10-08 LAB — URINALYSIS, ROUTINE W REFLEX MICROSCOPIC
Bilirubin, UA: NEGATIVE
Glucose, UA: NEGATIVE
Ketones, UA: NEGATIVE
Nitrite, UA: NEGATIVE
Protein,UA: NEGATIVE
RBC, UA: NEGATIVE
Specific Gravity, UA: 1.015 (ref 1.005–1.030)
Urobilinogen, Ur: 0.2 mg/dL (ref 0.2–1.0)
pH, UA: 7 (ref 5.0–7.5)

## 2022-10-08 LAB — MICROSCOPIC EXAMINATION

## 2022-10-08 MED ORDER — NITROFURANTOIN MACROCRYSTAL 50 MG PO CAPS: 50.0000 mg | ORAL_CAPSULE | Freq: Four times a day (QID) | ORAL | 11 refills | Status: DC

## 2022-10-08 NOTE — Progress Notes (Signed)
10/08/2022 3:01 PM   Heather Fleming 1950-12-18 [id number]  Referring provider: Coral Spikes, DO [place],  [place] [address]  Frequent UTI   HPI: Heather Fleming is a 72yo here for evaluation of frequent UTI. She has had frequent UTIs for the past 3-4 years.  She gets 6-7 UTIs per year. She has had 3 UTIs in the past 4 months. She saw Dr. Lovena Fleming for recurrent UTI. She is currently on D-mannose and cranberry. UA today shows 5-10 WBCs and few bacteria. She has mild SUI and wears a mini pad which is seldomly damp.    PMH: Past Medical History:  Diagnosis Date   Depression    GERD (gastroesophageal reflux disease)    Hyperlipemia    Hypertension    Pain in right foot 01/11/2020   UTI (urinary tract infection)     Surgical History: Past Surgical History:  Procedure Laterality Date   CHOLECYSTECTOMY     COLONOSCOPY     COLONOSCOPY N/A 04/03/2016   Procedure: COLONOSCOPY;  Surgeon: Rogene Houston, MD;  Location: [facility];  Service: Endoscopy;  Laterality: N/A;  1000   TONSILLECTOMY      Home Medications:  Allergies as of 10/08/2022       Reactions   Augmentin [amoxicillin-pot Clavulanate] Nausea Only   Upset Stomach   Codeine Itching   Azithromycin Nausea Only   Latex Rash        Medication List        Accurate as of October 08, 2022  3:01 PM. If you have any questions, ask your nurse or doctor.          aspirin 81 MG chewable tablet Chew 81 mg by mouth daily.   atorvastatin 40 MG tablet Commonly known as: LIPITOR Take 40 mg by mouth daily.   AZO CRANBERRY PO Take by mouth.   baclofen 20 MG tablet Commonly known as: LIORESAL Take 20 mg by mouth daily. '10mg'$  every other night   calcium carbonate 1250 (500 Ca) MG chewable tablet Commonly known as: OS-CAL Chew 1 tablet by mouth daily.   cetirizine 10 MG tablet Commonly known as: ZYRTEC Take 10 mg by mouth daily.   citalopram 20 MG tablet Commonly known as:  CELEXA Take 20 mg by mouth daily.   D-MANNOSE PO Take by mouth. For bladder   FISH OIL OMEGA-3 PO Take by mouth.   fluticasone 50 MCG/ACT nasal spray Commonly known as: FLONASE Place 1 spray into both nostrils daily.   HAIR SKIN AND NAILS FORMULA PO Take by mouth.   levocetirizine 5 MG tablet Commonly known as: XYZAL TAKE 1 TABLET BY MOUTH EVERY EVENING   losartan-hydrochlorothiazide 100-25 MG tablet Commonly known as: HYZAAR Take 1 tablet by mouth daily.   montelukast 10 MG tablet Commonly known as: SINGULAIR Take 10 mg by mouth at bedtime.   nitrofurantoin (macrocrystal-monohydrate) 100 MG capsule Commonly known as: Macrobid Take 1 capsule (100 mg total) by mouth 2 (two) times daily.   NON FORMULARY healthy feet and nerves   omeprazole 20 MG capsule Commonly known as: PRILOSEC Take 20 mg by mouth daily.   potassium chloride 10 MEQ tablet Commonly known as: KLOR-CON M Take 10 mEq by mouth 2 (two) times daily.   pregabalin 75 MG capsule Commonly known as: Lyrica Take 1 capsule (75 mg total) by mouth 2 (two) times daily.   propranolol 40 MG tablet Commonly known as: INDERAL Take 1 tablet (40 mg total)  by mouth 2 (two) times daily.        Allergies:  Allergies  Allergen Reactions   Augmentin [Amoxicillin-Pot Clavulanate] Nausea Only    Upset Stomach   Codeine Itching   Azithromycin Nausea Only   Latex Rash    Family History: Family History  Problem Relation Age of Onset   Allergic rhinitis Mother    Allergic rhinitis Father    Breast cancer Neg Hx     Social History:  reports that she has never smoked. She has never used smokeless tobacco. She reports current alcohol use. She reports that she does not use drugs.  ROS: All other review of systems were reviewed and are negative except what is noted above in HPI  Physical Exam: BP 132/77   Pulse 77   Constitutional:  Alert and oriented, No acute distress. HEENT: [place] AT, moist mucus  membranes.  Trachea midline, no masses. Cardiovascular: No clubbing, cyanosis, or edema. Respiratory: Normal respiratory effort, no increased work of breathing. GI: Abdomen is soft, nontender, nondistended, no abdominal masses GU: No CVA tenderness.  Lymph: No cervical or inguinal lymphadenopathy. Skin: No rashes, bruises or suspicious lesions. Neurologic: Grossly intact, no focal deficits, moving all 4 extremities. Psychiatric: Normal mood and affect.  Laboratory Data: Lab Results  Component Value Date   WBC 8.7 11/02/2015   HGB 13.0 11/02/2015   HCT 39.1 11/02/2015   MCV 91.8 11/02/2015   PLT 278 11/02/2015    Lab Results  Component Value Date   CREATININE 0.88 11/02/2015    No results found for: "PSA"  No results found for: "TESTOSTERONE"  No results found for: "HGBA1C"  Urinalysis    Component Value Date/Time   LEUKOCYTESUR Moderate (2+) (A) 08/29/2022 1052    No results found for: "LABMICR", "WBCUA", "RBCUA", "LABEPIT", "MUCUS", "BACTERIA"  Pertinent Imaging:  No results found for this or any previous visit.  No results found for this or any previous visit.  No results found for this or any previous visit.  No results found for this or any previous visit.  No results found for this or any previous visit.  No valid procedures specified. No results found for this or any previous visit.  No results found for this or any previous visit.   Assessment & Plan:    1. Recurrent UTI -We discussed the natural hx of recurrent UTIs and the various causes. We discussed the treatment options including post coital prophylaxis, daily prophylaxis, topical estrogen therapy. We will start macrodantin '50mg'$  qhs - BLADDER SCAN AMB NON-IMAGING - Urinalysis, Routine w reflex microscopic   No follow-ups on file.  Nicolette Bang, MD  [facility] [place]

## 2022-10-08 NOTE — Patient Instructions (Addendum)
Kegel Exercises  Kegel exercises can help strengthen your pelvic floor muscles. The pelvic floor is a group of muscles that support your rectum, small intestine, and bladder. In females, pelvic floor muscles also help support the uterus. These muscles help you control the flow of urine and stool (feces). Kegel exercises are painless and simple. They do not require any equipment. Your provider may suggest Kegel exercises to: Improve bladder and bowel control. Improve sexual response. Improve weak pelvic floor muscles after surgery to remove the uterus (hysterectomy) or after pregnancy, in females. Improve weak pelvic floor muscles after prostate gland removal or surgery, in males. Kegel exercises involve squeezing your pelvic floor muscles. These are the same muscles you squeeze when you try to stop the flow of urine or keep from passing gas. The exercises can be done while sitting, standing, or lying down, but it is best to vary your position. Ask your health care provider which exercises are safe for you. Do exercises exactly as told by your health care provider and adjust them as directed. Do not begin these exercises until told by your health care provider. Exercises How to do Kegel exercises: Squeeze your pelvic floor muscles tight. You should feel a tight lift in your rectal area. If you are a female, you should also feel a tightness in your vaginal area. Keep your stomach, buttocks, and legs relaxed. Hold the muscles tight for up to 10 seconds. Breathe normally. Relax your muscles for up to 10 seconds. Repeat as told by your health care provider. Repeat this exercise daily as told by your health care provider. Continue to do this exercise for at least 4-6 weeks, or for as long as told by your health care provider. You may be referred to a physical therapist who can help you learn more about how to do Kegel exercises. Depending on your condition, your health care provider may  recommend: Varying how long you squeeze your muscles. Doing several sets of exercises every day. Doing exercises for several weeks. Making Kegel exercises a part of your regular exercise routine. This information is not intended to replace advice given to you by your health care provider. Make sure you discuss any questions you have with your health care provider. Document Revised: 01/17/2021 Document Reviewed: 01/17/2021 Elsevier Patient Education  [place]. Urinary Tract Infection, Adult  A urinary tract infection (UTI) is an infection of any part of the urinary tract. The urinary tract includes the kidneys, ureters, bladder, and urethra. These organs make, store, and get rid of urine in the body. An upper UTI affects the ureters and kidneys. A lower UTI affects the bladder and urethra. What are the causes? Most urinary tract infections are caused by bacteria in your genital area around your urethra, where urine leaves your body. These bacteria grow and cause inflammation of your urinary tract. What increases the risk? You are more likely to develop this condition if: You have a urinary catheter that stays in place. You are not able to control when you urinate or have a bowel movement (incontinence). You are female and you: Use a spermicide or diaphragm for birth control. Have low estrogen levels. Are pregnant. You have certain genes that increase your risk. You are sexually active. You take antibiotic medicines. You have a condition that causes your flow of urine to slow down, such as: An enlarged prostate, if you are female. Blockage in your urethra. A kidney stone. A nerve condition that affects your bladder control (neurogenic  bladder). Not getting enough to drink, or not urinating often. You have certain medical conditions, such as: Diabetes. A weak disease-fighting system (immunesystem). Sickle cell disease. Gout. Spinal cord injury. What are the signs or  symptoms? Symptoms of this condition include: Needing to urinate right away (urgency). Frequent urination. This may include small amounts of urine each time you urinate. Pain or burning with urination. Blood in the urine. Urine that smells bad or unusual. Trouble urinating. Cloudy urine. Vaginal discharge, if you are female. Pain in the abdomen or the lower back. You may also have: Vomiting or a decreased appetite. Confusion. Irritability or tiredness. A fever or chills. Diarrhea. The first symptom in older adults may be confusion. In some cases, they may not have any symptoms until the infection has worsened. How is this diagnosed? This condition is diagnosed based on your medical history and a physical exam. You may also have other tests, including: Urine tests. Blood tests. Tests for STIs (sexually transmitted infections). If you have had more than one UTI, a cystoscopy or imaging studies may be done to determine the cause of the infections. How is this treated? Treatment for this condition includes: Antibiotic medicine. Over-the-counter medicines to treat discomfort. Drinking enough water to stay hydrated. If you have frequent infections or have other conditions such as a kidney stone, you may need to see a health care provider who specializes in the urinary tract (urologist). In rare cases, urinary tract infections can cause sepsis. Sepsis is a life-threatening condition that occurs when the body responds to an infection. Sepsis is treated in the hospital with IV antibiotics, fluids, and other medicines. Follow these instructions at home:  Medicines Take over-the-counter and prescription medicines only as told by your health care provider. If you were prescribed an antibiotic medicine, take it as told by your health care provider. Do not stop using the antibiotic even if you start to feel better. General instructions Make sure you: Empty your bladder often and completely.  Do not hold urine for long periods of time. Empty your bladder after sex. Wipe from front to back after urinating or having a bowel movement if you are female. Use each tissue only one time when you wipe. Drink enough fluid to keep your urine pale yellow. Keep all follow-up visits. This is important. Contact a health care provider if: Your symptoms do not get better after 1-2 days. Your symptoms go away and then return. Get help right away if: You have severe pain in your back or your lower abdomen. You have a fever or chills. You have nausea or vomiting. Summary A urinary tract infection (UTI) is an infection of any part of the urinary tract, which includes the kidneys, ureters, bladder, and urethra. Most urinary tract infections are caused by bacteria in your genital area. Treatment for this condition often includes antibiotic medicines. If you were prescribed an antibiotic medicine, take it as told by your health care provider. Do not stop using the antibiotic even if you start to feel better. Keep all follow-up visits. This is important. This information is not intended to replace advice given to you by your health care provider. Make sure you discuss any questions you have with your health care provider. Document Revised: 04/20/2020 Document Reviewed: 04/20/2020 Elsevier Patient Education  [place].

## 2022-10-10 LAB — URINE CULTURE

## 2022-10-11 DIAGNOSIS — G44209 Tension-type headache, unspecified, not intractable: Secondary | ICD-10-CM | POA: Diagnosis not present

## 2022-10-11 DIAGNOSIS — J329 Chronic sinusitis, unspecified: Secondary | ICD-10-CM | POA: Diagnosis not present

## 2022-10-11 DIAGNOSIS — U071 COVID-19: Secondary | ICD-10-CM | POA: Diagnosis not present

## 2022-10-16 ENCOUNTER — Other Ambulatory Visit: Payer: Self-pay | Admitting: Family Medicine

## 2022-10-17 ENCOUNTER — Telehealth: Payer: Self-pay | Admitting: *Deleted

## 2022-10-17 ENCOUNTER — Other Ambulatory Visit: Payer: Self-pay | Admitting: Family Medicine

## 2022-10-17 MED ORDER — NYSTATIN 100000 UNIT/ML MT SUSP: 5.0000 mL | Freq: Four times a day (QID) | OROMUCOSAL | 0 refills | Status: AC

## 2022-10-17 NOTE — Telephone Encounter (Signed)
Left message to return call 

## 2022-10-17 NOTE — Telephone Encounter (Signed)
Cook, Jayce G, DO   ? ?Rx sent.   ? ?

## 2022-10-17 NOTE — Telephone Encounter (Signed)
Patient stated she was seen in Urgent Care Saturday and dx with Covid  and sinus infection and given Steroids, antibiotics and cough meds Patient states she now has thrush and urgent care will not send in anything for her and is requesting a script to [facility]

## 2022-10-19 ENCOUNTER — Encounter: Payer: Self-pay | Admitting: Family Medicine

## 2022-10-20 ENCOUNTER — Encounter: Payer: Self-pay | Admitting: Urology

## 2022-10-20 NOTE — Telephone Encounter (Signed)
Coral Spikes, DO     This was prescribed by urology.

## 2022-10-20 NOTE — Telephone Encounter (Signed)
Patient notified via My Chart message.

## 2022-10-21 ENCOUNTER — Other Ambulatory Visit: Payer: Self-pay

## 2022-10-21 DIAGNOSIS — N390 Urinary tract infection, site not specified: Secondary | ICD-10-CM

## 2022-10-21 MED ORDER — NITROFURANTOIN MACROCRYSTAL 50 MG PO CAPS: 50.0000 mg | ORAL_CAPSULE | Freq: Every day | ORAL | 11 refills | Status: DC

## 2022-11-02 ENCOUNTER — Encounter: Payer: Self-pay | Admitting: Neurology

## 2022-11-03 ENCOUNTER — Telehealth: Payer: Self-pay

## 2022-11-03 ENCOUNTER — Other Ambulatory Visit: Payer: Self-pay | Admitting: Neurology

## 2022-11-03 MED ORDER — PREGABALIN 75 MG PO CAPS: 75.0000 mg | ORAL_CAPSULE | Freq: Two times a day (BID) | ORAL | 5 refills | Status: DC

## 2022-11-03 NOTE — Telephone Encounter (Signed)
Patient is completely out of [place]. Need a short supply to be called into  [facility], [place] Phone: [phone]  Fax: [phone]    To be picked up today.

## 2022-11-03 NOTE — Telephone Encounter (Signed)
Patient called and advised they needed a refill on medication below.   Medication: pregabalin (LYRICA) 75 MG capsule    Pharmacy: [facility]   Thank you

## 2022-11-03 NOTE — Telephone Encounter (Signed)
Please see below and refill if appropriate.

## 2022-11-03 NOTE — Progress Notes (Signed)
Due to side effects to gabapentin, PCP and I decided to change to Lyrica.  Doing well.  Refilled 53m BID

## 2022-11-07 ENCOUNTER — Other Ambulatory Visit: Payer: Self-pay

## 2022-11-07 MED ORDER — LEVOCETIRIZINE DIHYDROCHLORIDE 5 MG PO TABS: 5.0000 mg | ORAL_TABLET | Freq: Every evening | ORAL | 0 refills | Status: DC

## 2022-11-07 MED ORDER — MONTELUKAST SODIUM 10 MG PO TABS: 10.0000 mg | ORAL_TABLET | Freq: Every day | ORAL | 0 refills | Status: DC

## 2022-11-07 MED ORDER — FLUTICASONE PROPIONATE 50 MCG/ACT NA SUSP: 1.0000 | Freq: Every day | NASAL | 0 refills | Status: DC

## 2022-11-11 DIAGNOSIS — M9901 Segmental and somatic dysfunction of cervical region: Secondary | ICD-10-CM | POA: Diagnosis not present

## 2022-11-11 DIAGNOSIS — M9902 Segmental and somatic dysfunction of thoracic region: Secondary | ICD-10-CM | POA: Diagnosis not present

## 2022-11-11 DIAGNOSIS — S134XXA Sprain of ligaments of cervical spine, initial encounter: Secondary | ICD-10-CM | POA: Diagnosis not present

## 2022-11-11 DIAGNOSIS — S233XXA Sprain of ligaments of thoracic spine, initial encounter: Secondary | ICD-10-CM | POA: Diagnosis not present

## 2022-11-11 DIAGNOSIS — M9903 Segmental and somatic dysfunction of lumbar region: Secondary | ICD-10-CM | POA: Diagnosis not present

## 2022-11-28 DIAGNOSIS — G4452 New daily persistent headache (NDPH): Secondary | ICD-10-CM | POA: Diagnosis not present

## 2022-11-28 DIAGNOSIS — R059 Cough, unspecified: Secondary | ICD-10-CM | POA: Diagnosis not present

## 2022-11-28 DIAGNOSIS — R051 Acute cough: Secondary | ICD-10-CM | POA: Diagnosis not present

## 2022-11-28 DIAGNOSIS — Z20822 Contact with and (suspected) exposure to covid-19: Secondary | ICD-10-CM | POA: Diagnosis not present

## 2022-11-28 DIAGNOSIS — J0100 Acute maxillary sinusitis, unspecified: Secondary | ICD-10-CM | POA: Diagnosis not present

## 2022-12-04 ENCOUNTER — Encounter: Payer: Self-pay | Admitting: Family Medicine

## 2022-12-04 ENCOUNTER — Telehealth: Payer: Self-pay | Admitting: Family Medicine

## 2022-12-04 NOTE — Telephone Encounter (Signed)
Called patient to schedule Medicare Annual Wellness Visit (AWV). Left message for patient to call back and schedule Medicare Annual Wellness Visit (AWV).   Last date of AWV: due 04/22/2020 awvs per [place]  Please schedule an appointment at any time with Heather Fleming, [facility] .  If any questions, please contact me at [phone].  Thank you,  Colletta Maryland,  [place] [facility] Direct Dial ??[phone]

## 2022-12-05 ENCOUNTER — Other Ambulatory Visit: Payer: Self-pay

## 2022-12-05 ENCOUNTER — Other Ambulatory Visit: Payer: Self-pay | Admitting: Family Medicine

## 2022-12-05 MED ORDER — VALSARTAN-HYDROCHLOROTHIAZIDE 160-25 MG PO TABS: 1.0000 | ORAL_TABLET | Freq: Every day | ORAL | 3 refills | Status: DC

## 2022-12-05 MED ORDER — CITALOPRAM HYDROBROMIDE 20 MG PO TABS: 20.0000 mg | ORAL_TABLET | Freq: Every day | ORAL | 3 refills | Status: DC

## 2022-12-05 MED ORDER — LEVOCETIRIZINE DIHYDROCHLORIDE 5 MG PO TABS: 5.0000 mg | ORAL_TABLET | Freq: Every evening | ORAL | 3 refills | Status: DC

## 2022-12-05 MED ORDER — FLUTICASONE PROPIONATE 50 MCG/ACT NA SUSP: 2.0000 | Freq: Every day | NASAL | 0 refills | Status: DC

## 2023-01-06 DIAGNOSIS — N3001 Acute cystitis with hematuria: Secondary | ICD-10-CM | POA: Diagnosis not present

## 2023-01-06 DIAGNOSIS — R350 Frequency of micturition: Secondary | ICD-10-CM | POA: Diagnosis not present

## 2023-01-09 ENCOUNTER — Ambulatory Visit: Payer: Medicare Other | Admitting: Urology

## 2023-01-19 DIAGNOSIS — N3021 Other chronic cystitis with hematuria: Secondary | ICD-10-CM | POA: Diagnosis not present

## 2023-02-05 ENCOUNTER — Encounter: Payer: Self-pay | Admitting: Family Medicine

## 2023-02-17 DIAGNOSIS — H43813 Vitreous degeneration, bilateral: Secondary | ICD-10-CM | POA: Diagnosis not present

## 2023-02-17 DIAGNOSIS — H52223 Regular astigmatism, bilateral: Secondary | ICD-10-CM | POA: Diagnosis not present

## 2023-02-18 ENCOUNTER — Other Ambulatory Visit: Payer: Self-pay | Admitting: Family Medicine

## 2023-02-27 ENCOUNTER — Other Ambulatory Visit: Payer: Self-pay | Admitting: Family Medicine

## 2023-03-02 ENCOUNTER — Ambulatory Visit: Payer: Medicare Other | Admitting: Family Medicine

## 2023-03-02 MED ORDER — ATORVASTATIN CALCIUM 40 MG PO TABS: 40.0000 mg | ORAL_TABLET | Freq: Every day | ORAL | 0 refills | Status: DC

## 2023-03-10 ENCOUNTER — Ambulatory Visit (INDEPENDENT_AMBULATORY_CARE_PROVIDER_SITE_OTHER): Payer: Medicare Other | Admitting: Family Medicine

## 2023-03-10 ENCOUNTER — Encounter: Payer: Self-pay | Admitting: Family Medicine

## 2023-03-10 VITALS — BP 110/56 | HR 64 | Temp 98.1°F | Ht 63.5 in | Wt 200.0 lb

## 2023-03-10 DIAGNOSIS — R002 Palpitations: Secondary | ICD-10-CM

## 2023-03-10 DIAGNOSIS — G609 Hereditary and idiopathic neuropathy, unspecified: Secondary | ICD-10-CM

## 2023-03-10 DIAGNOSIS — E785 Hyperlipidemia, unspecified: Secondary | ICD-10-CM | POA: Diagnosis not present

## 2023-03-10 DIAGNOSIS — I10 Essential (primary) hypertension: Secondary | ICD-10-CM | POA: Diagnosis not present

## 2023-03-10 DIAGNOSIS — R7303 Prediabetes: Secondary | ICD-10-CM

## 2023-03-10 NOTE — Assessment & Plan Note (Signed)
Improved on Lyrica.  Continue.

## 2023-03-10 NOTE — Assessment & Plan Note (Signed)
Stable.  Continue valsartan/HCTZ.
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
## 2023-03-10 NOTE — Patient Instructions (Signed)
Labs today.  Continue your medication.   Referral placed to cardiology.  Follow up in 6 months. Call with concerns.

## 2023-03-10 NOTE — Assessment & Plan Note (Signed)
Continue statin. Lipid panel ordered. 

## 2023-03-10 NOTE — Assessment & Plan Note (Signed)
Referring to cardiology.  Needs cardiac monitor/holter.

## 2023-03-11 LAB — CMP14+EGFR
ALT: 19 IU/L (ref 0–32)
AST: 26 IU/L (ref 0–40)
Albumin: 4.4 g/dL (ref 3.8–4.8)
Alkaline Phosphatase: 78 IU/L (ref 44–121)
BUN/Creatinine Ratio: 14 (ref 12–28)
BUN: 13 mg/dL (ref 8–27)
Bilirubin Total: 0.4 mg/dL (ref 0.0–1.2)
CO2: 25 mmol/L (ref 20–29)
Calcium: 9.6 mg/dL (ref 8.7–10.3)
Chloride: 96 mmol/L (ref 96–106)
Creatinine, Ser: 0.91 mg/dL (ref 0.57–1.00)
Globulin, Total: 2.3 g/dL (ref 1.5–4.5)
Glucose: 79 mg/dL (ref 70–99)
Potassium: 4.5 mmol/L (ref 3.5–5.2)
Sodium: 135 mmol/L (ref 134–144)
Total Protein: 6.7 g/dL (ref 6.0–8.5)
eGFR: 67 mL/min/{1.73_m2} (ref >59)

## 2023-03-11 LAB — LIPID PANEL
Chol/HDL Ratio: 2.3 ratio (ref 0.0–4.4)
Cholesterol, Total: 151 mg/dL (ref 100–199)
HDL: 66 mg/dL (ref >39)
LDL Chol Calc (NIH): 62 mg/dL (ref 0–99)
Triglycerides: 137 mg/dL (ref 0–149)
VLDL Cholesterol Cal: 23 mg/dL (ref 5–40)

## 2023-03-11 LAB — CBC
Hematocrit: 40.3 % (ref 34.0–46.6)
Hemoglobin: 13.3 g/dL (ref 11.1–15.9)
MCH: 30.2 pg (ref 26.6–33.0)
MCHC: 33 g/dL (ref 31.5–35.7)
MCV: 91 fL (ref 79–97)
Platelets: 331 10*3/uL (ref 150–450)
RBC: 4.41 x10E6/uL (ref 3.77–5.28)
RDW: 12.8 % (ref 11.7–15.4)
WBC: 8.4 10*3/uL (ref 3.4–10.8)

## 2023-03-11 LAB — MICROALBUMIN / CREATININE URINE RATIO
Creatinine, Urine: 45.1 mg/dL
Microalb/Creat Ratio: <7 mg/g creat (ref 0–29)
Microalbumin, Urine: <3 ug/mL

## 2023-03-11 LAB — MAGNESIUM: Magnesium: 2 mg/dL (ref 1.6–2.3)

## 2023-03-11 LAB — HEMOGLOBIN A1C
Est. average glucose Bld gHb Est-mCnc: 126 mg/dL
Hgb A1c MFr Bld: 6 % — ABNORMAL HIGH (ref 4.8–5.6)

## 2023-03-11 LAB — TSH: TSH: 1.96 u[IU]/mL (ref 0.450–4.500)

## 2023-04-02 ENCOUNTER — Other Ambulatory Visit: Payer: Self-pay | Admitting: Family Medicine

## 2023-04-02 MED ORDER — MONTELUKAST SODIUM 10 MG PO TABS: 10.0000 mg | ORAL_TABLET | Freq: Every day | ORAL | 0 refills | Status: DC

## 2023-04-02 MED ORDER — FLUTICASONE PROPIONATE 50 MCG/ACT NA SUSP: 2.0000 | Freq: Every day | NASAL | 0 refills | Status: DC

## 2023-04-02 MED ORDER — ATORVASTATIN CALCIUM 40 MG PO TABS: 40.0000 mg | ORAL_TABLET | Freq: Every day | ORAL | 0 refills | Status: DC

## 2023-04-02 MED ORDER — LEVOCETIRIZINE DIHYDROCHLORIDE 5 MG PO TABS: 5.0000 mg | ORAL_TABLET | Freq: Every evening | ORAL | 0 refills | Status: DC

## 2023-04-03 ENCOUNTER — Ambulatory Visit: Payer: Medicare Other | Attending: Internal Medicine | Admitting: Internal Medicine

## 2023-04-03 ENCOUNTER — Encounter: Payer: Self-pay | Admitting: Internal Medicine

## 2023-04-03 VITALS — BP 134/68 | HR 61 | Ht 64.0 in | Wt 199.4 lb

## 2023-04-03 DIAGNOSIS — R002 Palpitations: Secondary | ICD-10-CM | POA: Diagnosis not present

## 2023-04-03 NOTE — Patient Instructions (Signed)
Medication Instructions:  Continue all current medications.  Labwork: none  Testing/Procedures: none  Follow-Up: As needed.    Any Other Special Instructions Will Be Listed Below (If Applicable).  If you need a refill on your cardiac medications before your next appointment, please call your pharmacy.  

## 2023-04-03 NOTE — Progress Notes (Signed)
Cardiology Office Note  Date: 04/03/2023   ID: Heather Fleming, Heather Fleming 1951-07-31, MRN [id number]  PCP:  Heather Sams, DO  Cardiologist:  Marjo Bicker, MD Electrophysiologist:  None   Reason for Office Visit: Evaluation of palpitations   History of Present Illness: Heather Fleming is a 72 y.o. female known to have HTN, HLD was referred to cardiology clinic for evaluation of palpitations.   Patient has ongoing palpitations for the last 1 month, last for few seconds and occurs multiple times throughout the day. Patient was extremely scared and hence wanted to get her heart checked out.  She drinks 2 cups of coffee daily. Denies any angina, DOE, orthopnea, PND or leg swelling. No dizziness, syncope. No prior history of MI/PCI/CABG. Her brother had exposure to agent orange, underwent open heart surgery and had a defibrillator.  Past Medical History:  Diagnosis Date   Depression    Diabetes mellitus without complication (HCC)    Borderline/years   GERD (gastroesophageal reflux disease)    Hyperlipemia    Hypertension    Pain in right foot 01/11/2020   UTI (urinary tract infection)     Past Surgical History:  Procedure Laterality Date   CHOLECYSTECTOMY     COLONOSCOPY     COLONOSCOPY N/A 04/03/2016   Procedure: COLONOSCOPY;  Surgeon: Malissa Hippo, MD;  Location: [facility];  Service: Endoscopy;  Laterality: N/A;  1000   TONSILLECTOMY      Current Outpatient Medications  Medication Sig Dispense Refill   albuterol (VENTOLIN HFA) 108 (90 Base) MCG/ACT inhaler 2 puffs every 6 (six) hours as needed.     aspirin 81 MG chewable tablet Chew 81 mg by mouth daily.     atorvastatin (LIPITOR) 40 MG tablet Take 1 tablet (40 mg total) by mouth daily. 90 tablet 0   baclofen (LIORESAL) 20 MG tablet Take 20 mg by mouth daily. 10mg  every other night     calcium carbonate (OS-CAL) 1250 (500 Ca) MG chewable tablet Chew 1 tablet by mouth daily.     cetirizine (ZYRTEC)  10 MG tablet Take 10 mg by mouth daily.     citalopram (CELEXA) 20 MG tablet Take 1 tablet (20 mg total) by mouth daily. 90 tablet 3   Cranberry-Vitamin C-Probiotic (AZO CRANBERRY PO) Take by mouth.     D-MANNOSE PO Take by mouth. For bladder     estradiol (ESTRACE) 0.1 MG/GM vaginal cream Place 1 Applicatorful vaginally at bedtime.     fluticasone (FLONASE) 50 MCG/ACT nasal spray Place 2 sprays into both nostrils daily. 18.2 mL 0   levocetirizine (XYZAL) 5 MG tablet Take 1 tablet (5 mg total) by mouth every evening. 90 tablet 0   montelukast (SINGULAIR) 10 MG tablet Take 1 tablet (10 mg total) by mouth at bedtime. 90 tablet 0   Multiple Vitamins-Minerals (HAIR SKIN AND NAILS FORMULA PO) Take by mouth.     nitrofurantoin (MACRODANTIN) 50 MG capsule Take 1 capsule (50 mg total) by mouth at bedtime. 30 capsule 11   NON FORMULARY healthy feet and nerves     omeprazole (PRILOSEC) 20 MG capsule Take 20 mg by mouth daily.     potassium chloride (K-DUR,KLOR-CON) 10 MEQ tablet Take 10 mEq by mouth 2 (two) times daily.     pregabalin (LYRICA) 75 MG capsule Take 1 capsule (75 mg total) by mouth 2 (two) times daily. 60 capsule 5   propranolol (INDERAL) 40 MG tablet Take 1 tablet (40 mg total)  by mouth 2 (two) times daily. 180 tablet 3   valsartan-hydrochlorothiazide (DIOVAN-HCT) 160-25 MG tablet Take 1 tablet by mouth daily. 90 tablet 3   No current facility-administered medications for this visit.   Allergies:  Augmentin [amoxicillin-pot clavulanate], Codeine, Azithromycin, and Latex   Social History: The patient  reports that she has never smoked. She has never used smokeless tobacco. She reports current alcohol use of about 3.0 standard drinks of alcohol per week. She reports that she does not use drugs.   Family History: The patient's family history includes Allergic rhinitis in her father and mother; Asthma in her brother; Heart attack in her brother; Heart disease in her brother, father, and  mother; Heart failure in her mother.   ROS:  Please see the history of present illness. Otherwise, complete review of systems is positive for none  All other systems are reviewed and negative.   Physical Exam: VS:  BP 134/68 (BP Location: Left Arm, Patient Position: Sitting, Cuff Size: Normal)   Pulse 61   Ht 5\' 4"  (1.626 m)   Wt 199 lb 6.4 oz (90.4 kg)   SpO2 98%   BMI 34.23 kg/m , BMI Body mass index is 34.23 kg/m.  Wt Readings from Last 3 Encounters:  04/03/23 199 lb 6.4 oz (90.4 kg)  03/10/23 200 lb (90.7 kg)  08/29/22 193 lb 9.6 oz (87.8 kg)    General: Patient appears comfortable at rest. HEENT: Conjunctiva and lids normal, oropharynx clear with moist mucosa. Neck: Supple, no elevated JVP or carotid bruits, no thyromegaly. Lungs: Clear to auscultation, nonlabored breathing at rest. Cardiac: Regular rate and rhythm, no S3 or significant systolic murmur, no pericardial rub. Abdomen: Soft, nontender, no hepatomegaly, bowel sounds present, no guarding or rebound. Extremities: No pitting edema, distal pulses 2+. Skin: Warm and dry. Musculoskeletal: No kyphosis. Neuropsychiatric: Alert and oriented x3, affect grossly appropriate.  Recent Labwork: 03/10/2023: ALT 19; AST 26; BUN 13; Creatinine, Ser 0.91; Hemoglobin 13.3; Magnesium 2.0; Platelets 331; Potassium 4.5; Sodium 135; TSH 1.960     Component Value Date/Time   CHOL 151 03/10/2023 1129   TRIG 137 03/10/2023 1129   HDL 66 03/10/2023 1129   CHOLHDL 2.3 03/10/2023 1129   LDLCALC 62 03/10/2023 1129    Other Studies Reviewed Today:   Assessment and Plan:   # Palpitations -Palpitations x 1 month, lasting for a few seconds and occurring multiple times at rest. Last episode of palpitations was 1 and half week ago. Drinks 2 cups of coffee daily.  Educated patient to cut down on the coffee or switch to decaffeinated coffee and reevaluate for any recurrence of palpitations.  If she has recurrent palpitations despite cutting  down coffee, she will need to call the clinic to ship her 2-week event monitor.  # HTN, controlled -Continue valsartan-HCTZ 160-25 mg once daily.  Follows with  # HLD, at goal -Continue atorvastatin 40 mg at bedtime, goal LDL less than 100.  LDL 62, normal event 3 weeks ago.   I have spent a total of 45 minutes with patient reviewing chart, EKGs, labs and examining patient as well as establishing an assessment and plan that was discussed with the patient.  > 50% of time was spent in direct patient care.    Medication Adjustments/Labs and Tests Ordered: Current medicines are reviewed at length with the patient today.  Concerns regarding medicines are outlined above.   Tests Ordered: Orders Placed This Encounter  Procedures   EKG 12-Lead    Medication  Changes: No orders of the defined types were placed in this encounter.   Disposition:  Follow up prn  Signed Angelis Gates Verne Spurr, MD, 04/03/2023 3:36 PM    [facility] [address] [place], [place], [place] [address]

## 2023-04-13 ENCOUNTER — Encounter (INDEPENDENT_AMBULATORY_CARE_PROVIDER_SITE_OTHER): Payer: Self-pay | Admitting: *Deleted

## 2023-04-13 DIAGNOSIS — M9903 Segmental and somatic dysfunction of lumbar region: Secondary | ICD-10-CM | POA: Diagnosis not present

## 2023-04-13 DIAGNOSIS — S134XXA Sprain of ligaments of cervical spine, initial encounter: Secondary | ICD-10-CM | POA: Diagnosis not present

## 2023-04-13 DIAGNOSIS — M9902 Segmental and somatic dysfunction of thoracic region: Secondary | ICD-10-CM | POA: Diagnosis not present

## 2023-04-13 DIAGNOSIS — M9901 Segmental and somatic dysfunction of cervical region: Secondary | ICD-10-CM | POA: Diagnosis not present

## 2023-04-13 DIAGNOSIS — S233XXA Sprain of ligaments of thoracic spine, initial encounter: Secondary | ICD-10-CM | POA: Diagnosis not present

## 2023-04-13 DIAGNOSIS — S338XXA Sprain of other parts of lumbar spine and pelvis, initial encounter: Secondary | ICD-10-CM | POA: Diagnosis not present

## 2023-04-19 ENCOUNTER — Other Ambulatory Visit: Payer: Self-pay | Admitting: Neurology

## 2023-04-20 ENCOUNTER — Other Ambulatory Visit: Payer: Self-pay | Admitting: Family Medicine

## 2023-04-20 DIAGNOSIS — S233XXA Sprain of ligaments of thoracic spine, initial encounter: Secondary | ICD-10-CM | POA: Diagnosis not present

## 2023-04-20 DIAGNOSIS — M9903 Segmental and somatic dysfunction of lumbar region: Secondary | ICD-10-CM | POA: Diagnosis not present

## 2023-04-20 DIAGNOSIS — M9901 Segmental and somatic dysfunction of cervical region: Secondary | ICD-10-CM | POA: Diagnosis not present

## 2023-04-20 DIAGNOSIS — S134XXA Sprain of ligaments of cervical spine, initial encounter: Secondary | ICD-10-CM | POA: Diagnosis not present

## 2023-04-20 DIAGNOSIS — S338XXA Sprain of other parts of lumbar spine and pelvis, initial encounter: Secondary | ICD-10-CM | POA: Diagnosis not present

## 2023-04-20 DIAGNOSIS — Z1231 Encounter for screening mammogram for malignant neoplasm of breast: Secondary | ICD-10-CM

## 2023-04-20 DIAGNOSIS — M9902 Segmental and somatic dysfunction of thoracic region: Secondary | ICD-10-CM | POA: Diagnosis not present

## 2023-04-26 ENCOUNTER — Other Ambulatory Visit: Payer: Self-pay | Admitting: Neurology

## 2023-04-27 DIAGNOSIS — H43813 Vitreous degeneration, bilateral: Secondary | ICD-10-CM | POA: Diagnosis not present

## 2023-04-30 ENCOUNTER — Ambulatory Visit: Payer: Medicare Other

## 2023-05-02 ENCOUNTER — Other Ambulatory Visit: Payer: Self-pay | Admitting: Family Medicine

## 2023-05-06 ENCOUNTER — Ambulatory Visit
Admission: RE | Admit: 2023-05-06 | Discharge: 2023-05-06 | Disposition: A | Discharge status: Home / Self Care | Payer: Medicare Other | Source: Ambulatory Visit | Attending: Family Medicine | Admitting: Family Medicine

## 2023-05-06 DIAGNOSIS — Z1231 Encounter for screening mammogram for malignant neoplasm of breast: Secondary | ICD-10-CM

## 2023-05-10 ENCOUNTER — Other Ambulatory Visit: Payer: Self-pay | Admitting: Family Medicine

## 2023-05-11 MED ORDER — ATORVASTATIN CALCIUM 40 MG PO TABS: 40.0000 mg | ORAL_TABLET | Freq: Every day | ORAL | 0 refills | Status: DC

## 2023-05-11 MED ORDER — OMEPRAZOLE 20 MG PO CPDR: 20.0000 mg | DELAYED_RELEASE_CAPSULE | Freq: Every day | ORAL | 1 refills | Status: DC

## 2023-05-11 MED ORDER — FLUTICASONE PROPIONATE 50 MCG/ACT NA SUSP: 2.0000 | Freq: Every day | NASAL | 0 refills | Status: DC

## 2023-05-11 MED ORDER — LEVOCETIRIZINE DIHYDROCHLORIDE 5 MG PO TABS: 5.0000 mg | ORAL_TABLET | Freq: Every evening | ORAL | 0 refills | Status: DC

## 2023-05-29 ENCOUNTER — Ambulatory Visit (INDEPENDENT_AMBULATORY_CARE_PROVIDER_SITE_OTHER): Payer: Medicare Other | Admitting: Nurse Practitioner

## 2023-05-29 ENCOUNTER — Encounter: Payer: Self-pay | Admitting: Nurse Practitioner

## 2023-05-29 VITALS — BP 122/73 | HR 59 | Temp 97.1°F | Wt 196.8 lb

## 2023-05-29 DIAGNOSIS — R300 Dysuria: Secondary | ICD-10-CM | POA: Diagnosis not present

## 2023-05-29 DIAGNOSIS — N390 Urinary tract infection, site not specified: Secondary | ICD-10-CM

## 2023-05-29 DIAGNOSIS — J069 Acute upper respiratory infection, unspecified: Secondary | ICD-10-CM

## 2023-05-29 LAB — POCT URINALYSIS DIP (CLINITEK)
Bilirubin, UA: NEGATIVE
Blood, UA: NEGATIVE
Glucose, UA: NEGATIVE mg/dL
Ketones, POC UA: NEGATIVE mg/dL
Nitrite, UA: NEGATIVE
POC PROTEIN,UA: NEGATIVE
Spec Grav, UA: 1.015 (ref 1.010–1.025)
Urobilinogen, UA: 0.2 U/dL
pH, UA: 7.5 (ref 5.0–8.0)

## 2023-05-29 MED ORDER — HYDROCORTISONE ACETATE 30 MG RE SUPP: RECTAL | 0 refills | Status: DC

## 2023-05-29 MED ORDER — NITROFURANTOIN MONOHYD MACRO 100 MG PO CAPS: 100.0000 mg | ORAL_CAPSULE | Freq: Two times a day (BID) | ORAL | 0 refills | Status: DC

## 2023-05-30 ENCOUNTER — Encounter: Payer: Self-pay | Admitting: Nurse Practitioner

## 2023-05-30 NOTE — Progress Notes (Signed)
Subjective:    Patient ID: Heather Fleming, female    DOB: 1951/07/13, 72 y.o.   MRN: [id number]  HPI Presents for possible UTI.  Patient has a history of recurrent UTIs.  Was on once daily Macrobid for months, has been off about a week.  Began having urinary symptoms again on 05/27/2023.  Describes burning, odor, pressure, urgency and frequency.  No back or flank pain.  Taking fluids well.  No nausea or vomiting. In addition patient has had head congestion since 9/3.  Had a negative COVID test outside of the office.  Symptoms are improving.  Slight cough.  Minimal wheezing.  Has an albuterol inhaler in case it is needed.  No fever.  Sore throat has resolved.  No ear pain.  No chest pain or shortness of breath.  Facial pressure and headache has improved.  Has taken OTC meds for her symptoms.        Objective:   Physical Exam NAD.  Alert, oriented.  TMs retracted, no erythema.  Nares mildly boggy.  Pharynx clear and moist.  Neck supple with mild soft anterior adenopathy.  Lungs clear.  Heart regular rate rhythm.  Abdomen soft nondistended with minimal suprapubic area discomfort.  No CVA or flank tenderness noted. Results for orders placed or performed in visit on 05/29/23  POCT URINALYSIS DIP (CLINITEK)  Result Value Ref Range   Color, UA     Clarity, UA clear clear   Glucose, UA negative negative mg/dL   Bilirubin, UA negative negative   Ketones, POC UA negative negative mg/dL   Spec Grav, UA 4.098 1.191 - 1.025   Blood, UA negative negative   pH, UA 7.5 5.0 - 8.0   POC PROTEIN,UA negative negative, trace   Urobilinogen, UA 0.2 0.2 or 1.0 E.U./dL   Nitrite, UA Negative Negative   Leukocytes, UA Small (1+) (A) Negative   Today's Vitals   05/29/23 1543  BP: 122/73  Pulse: (!) 59  Temp: (!) 97.1 F (36.2 C)  SpO2: 97%  Weight: 196 lb 12.8 oz (89.3 kg)   Body mass index is 33.78 kg/m.          Assessment & Plan:   Problem List Items Addressed This Visit        Genitourinary   Recurrent UTI   Relevant Medications   nitrofurantoin, macrocrystal-monohydrate, (MACROBID) 100 MG capsule   Other Visit Diagnoses     Viral URI    -  Primary   Relevant Medications   nitrofurantoin, macrocrystal-monohydrate, (MACROBID) 100 MG capsule   Other Relevant Orders   POCT URINALYSIS DIP (CLINITEK) (Completed)   Dysuria       Relevant Orders   POCT URINALYSIS DIP (CLINITEK) (Completed)      Meds ordered this encounter  Medications   nitrofurantoin, macrocrystal-monohydrate, (MACROBID) 100 MG capsule    Sig: Take 1 capsule (100 mg total) by mouth 2 (two) times daily.    Dispense:  10 capsule    Refill:  0    Order Specific Question:   Supervising Provider    Answer:   Lilyan Punt A [[id number]]    Restart Macrobid with twice daily dosing for the next 5 days due to changes in her UA and her symptoms.  Recommend that patient get back with her urologist early next week to discuss further action due to recurrent UTIs. OTC meds as directed for head congestion.  Warning signs reviewed.  Call back next week if no improvement, sooner if worse.

## 2023-05-31 ENCOUNTER — Encounter: Payer: Self-pay | Admitting: Nurse Practitioner

## 2023-06-01 NOTE — Addendum Note (Signed)
Addended by: Campbell Riches on: 06/01/2023 08:10 PM   Modules accepted: Orders

## 2023-06-01 NOTE — Progress Notes (Unsigned)
NEUROLOGY FOLLOW UP OFFICE NOTE  Heather Fleming [id number]  Assessment/Plan:   1.  Migraine without aura, without status migrainosus, not intractable 2.  Migraine with aura,, without status migrainosus, not intractable 3.  Cervical degenerative disc disease 4.  Idiopathic polyneuropathy    Migraine prevention:  propranolol 40mg  twice daily *** ***  Subjective:  Heather Fleming is a 72 year old right-handed female with HTN, HLD and IBS who follows up for migraines and neuropathy.  UPDATE: Migraines: ***  Current NSAIDS/analgesics:  ASA 81mg  daily, Tylenol Arthritis, naproxen (upsets stomach) Current triptans:  none Current ergotamine:  none Current anti-emetic:  none Current muscle relaxants:  Baclofen 20mg  Current Antihypertensive medications:  Propranolol 40mg  BID Current Antidepressant medications:  Celexa 20mg  daily Current Anticonvulsant medications:  Gabapentin 300mg  - 600mg  at bedtime (neuropathic pain) Current anti-CGRP:  none Current Vitamins/Herbal/Supplements:  none Current Antihistamines/Decongestants:  Xyzal, Flonase Other therapy:  home neck exercises/chiropractor Hormone/birth control:  none   Other pain:  Cervical DDD, stiff neck  Neuropathy: Labs in November 2023 included negative ANA, negative ENA panel (including Sjogren's and ds-DNA), negative RF, normal immunofixation, ACE 40.  HISTORY: Migraines: ***  Neuropathy:  She reports history of neuropathy since 2017. She did see neurology at that time.  Labs at that time included Hgb A1c 6.2, sed rate 15, negative RPR, TSH 3.125, and B12 265 . A NCV/EMG was ordered but never scheduled.  She never followed up with the neurologist.  She typically has numbness in the feet.  When laying down, she may get pins and needles sensation radiating up the shins.  Earlier this week, she got up and felt a crick in her back and had urinary incontinence.  A day or two later, she was sitting in the recliner  with her legs up and applying Frankincense oil on her legs when she developed numbness travelling up her legs to the mid thighs.  It lasted into the next day.    She was diagnosed and prescribed Keflex for a UTI.     Current medications include:  gabapentin 300mg -600mg  at bedtime.  Also takes citalopram 20mg  daily. Past medications:  amitriptyline  PAST MEDICAL HISTORY: Past Medical History:  Diagnosis Date   Depression    Diabetes mellitus without complication (HCC)    Borderline/years   GERD (gastroesophageal reflux disease)    Hyperlipemia    Hypertension    Pain in right foot 01/11/2020   UTI (urinary tract infection)     MEDICATIONS: Current Outpatient Medications on File Prior to Visit  Medication Sig Dispense Refill   albuterol (VENTOLIN HFA) 108 (90 Base) MCG/ACT inhaler 2 puffs every 6 (six) hours as needed.     aspirin 81 MG chewable tablet Chew 81 mg by mouth daily.     atorvastatin (LIPITOR) 40 MG tablet Take 1 tablet (40 mg total) by mouth daily. 90 tablet 0   baclofen (LIORESAL) 20 MG tablet Take 20 mg by mouth daily. 10mg  every other night     calcium carbonate (OS-CAL) 1250 (500 Ca) MG chewable tablet Chew 1 tablet by mouth daily.     cetirizine (ZYRTEC) 10 MG tablet Take 10 mg by mouth daily.     citalopram (CELEXA) 20 MG tablet Take 1 tablet (20 mg total) by mouth daily. 90 tablet 3   Cranberry-Vitamin C-Probiotic (AZO CRANBERRY PO) Take by mouth.     D-MANNOSE PO Take by mouth. For bladder     estradiol (ESTRACE) 0.1 MG/GM vaginal cream Place 1  Applicatorful vaginally at bedtime.     fluticasone (FLONASE) 50 MCG/ACT nasal spray Place 2 sprays into both nostrils daily. 18.2 mL 0   HYDROCORTISONE ACE, RECTAL, 30 MG SUPP Use one suppository rectally BID up to 2 weeks for hemorrhoids 28 suppository 0   levocetirizine (XYZAL) 5 MG tablet Take 1 tablet (5 mg total) by mouth every evening. 90 tablet 0   montelukast (SINGULAIR) 10 MG tablet TAKE 1 TABLET BY MOUTH AT   BEDTIME 90 tablet 3   Multiple Vitamins-Minerals (HAIR SKIN AND NAILS FORMULA PO) Take by mouth.     nitrofurantoin, macrocrystal-monohydrate, (MACROBID) 100 MG capsule Take 1 capsule (100 mg total) by mouth 2 (two) times daily. 10 capsule 0   NON FORMULARY healthy feet and nerves     omeprazole (PRILOSEC) 20 MG capsule Take 1 capsule (20 mg total) by mouth daily. 90 capsule 1   potassium chloride (K-DUR,KLOR-CON) 10 MEQ tablet Take 10 mEq by mouth 2 (two) times daily.     pregabalin (LYRICA) 75 MG capsule TAKE 1 CAPSULE BY MOUTH TWICE DAILY 60 capsule 5   propranolol (INDERAL) 40 MG tablet TAKE 1 TABLET BY MOUTH TWICE  DAILY 180 tablet 0   valsartan-hydrochlorothiazide (DIOVAN-HCT) 160-25 MG tablet Take 1 tablet by mouth daily. 90 tablet 3   No current facility-administered medications on file prior to visit.    ALLERGIES: Allergies  Allergen Reactions   Augmentin [Amoxicillin-Pot Clavulanate] Nausea Only    Upset Stomach   Codeine Itching   Azithromycin Nausea Only   Latex Rash    FAMILY HISTORY: Family History  Problem Relation Age of Onset   Allergic rhinitis Mother    Heart disease Mother    Heart failure Mother    Allergic rhinitis Father    Heart disease Father    Asthma Brother        Since a child   Heart attack Brother        1st @ 8, had several, has defibrillator,   Heart disease Brother    Breast cancer Neg Hx       Objective:  *** General: No acute distress.  Patient appears well-groomed.   Head:  Normocephalic/atraumatic Eyes:  Fundi examined but not visualized Neck: supple, no paraspinal tenderness, full range of motion Heart:  Regular rate and rhythm Neurological Exam: alert and oriented to person, place, and time.  Speech fluent and not dysarthric, language intact.  CN II-XII intact. Bulk and tone normal, muscle strength 5/5 throughout.  Sensation to pinprick reduced in toes up to mid shins, vibratory sensation slightly reduced.  Deep tendon reflexes  1+ throughout, toes downgoing.  Finger to nose testing intact.  Gait normal, Romberg negative.   Shon Millet, DO

## 2023-06-02 ENCOUNTER — Ambulatory Visit: Payer: Medicare Other | Admitting: Neurology

## 2023-06-02 ENCOUNTER — Encounter: Payer: Self-pay | Admitting: Neurology

## 2023-06-02 VITALS — BP 130/68 | HR 69 | Ht 64.0 in | Wt 198.6 lb

## 2023-06-02 DIAGNOSIS — G609 Hereditary and idiopathic neuropathy, unspecified: Secondary | ICD-10-CM | POA: Diagnosis not present

## 2023-06-02 DIAGNOSIS — G43109 Migraine with aura, not intractable, without status migrainosus: Secondary | ICD-10-CM

## 2023-06-02 NOTE — Patient Instructions (Signed)
Stop propranolol Continue Lyrica Follow up 6 months.

## 2023-06-03 ENCOUNTER — Telehealth: Payer: Self-pay | Admitting: Neurology

## 2023-06-03 NOTE — Telephone Encounter (Signed)
Patient c/o headache top of her head with tingling in cheeks no numbness or tingling down herr arms or no slurred speech

## 2023-06-03 NOTE — Telephone Encounter (Signed)
Patient called stating that she has stopped taking the proprananol like dr. Everlena Cooper requested and her face is tingling and feeling funny and now she has a headache

## 2023-06-04 ENCOUNTER — Other Ambulatory Visit: Payer: Self-pay | Admitting: Family Medicine

## 2023-06-04 ENCOUNTER — Other Ambulatory Visit: Payer: Self-pay | Admitting: Neurology

## 2023-06-04 MED ORDER — PROPRANOLOL HCL 40 MG PO TABS: 40.0000 mg | ORAL_TABLET | Freq: Two times a day (BID) | ORAL | Status: DC

## 2023-06-04 MED ORDER — PROPRANOLOL HCL 40 MG PO TABS: 40.0000 mg | ORAL_TABLET | Freq: Two times a day (BID) | ORAL | 5 refills | Status: DC

## 2023-06-04 NOTE — Telephone Encounter (Signed)
Per patient she feeling better today but she just woke.   Advised patient to go ahead and start the Propranolol.      OK to restart propranolol 40mg  twice daily.

## 2023-06-05 ENCOUNTER — Telehealth: Payer: Self-pay | Admitting: Family Medicine

## 2023-06-05 ENCOUNTER — Other Ambulatory Visit: Payer: Self-pay | Admitting: Nurse Practitioner

## 2023-06-05 MED ORDER — POTASSIUM CHLORIDE CRYS ER 10 MEQ PO TBCR: 10.0000 meq | EXTENDED_RELEASE_TABLET | Freq: Two times a day (BID) | ORAL | 0 refills | Status: DC

## 2023-06-05 NOTE — Telephone Encounter (Signed)
Done. Refilled at previous dose

## 2023-06-05 NOTE — Telephone Encounter (Signed)
Patient needs prescription for potassium chloride (K-DUR,KLOR-CON) 10 MEQ tablet  Send to [facility]

## 2023-06-08 ENCOUNTER — Other Ambulatory Visit: Payer: Self-pay

## 2023-06-08 ENCOUNTER — Telehealth: Payer: Self-pay | Admitting: Nurse Practitioner

## 2023-06-08 MED ORDER — POTASSIUM CHLORIDE CRYS ER 10 MEQ PO TBCR: 10.0000 meq | EXTENDED_RELEASE_TABLET | Freq: Two times a day (BID) | ORAL | 0 refills | Status: DC

## 2023-06-08 NOTE — Telephone Encounter (Signed)
Patient was on antibiotic and now has UTI and needing something called in. Can you call Dr, Cristal Deer Liliane Shi the urologist .He has a private practice not under [facility] [phone]. Patient call and she states they were real rude to her. She thought you might can get further than she did. [facility]

## 2023-06-10 ENCOUNTER — Encounter: Payer: Self-pay | Admitting: Nurse Practitioner

## 2023-06-10 NOTE — Telephone Encounter (Signed)
Mychart message sent.

## 2023-06-28 ENCOUNTER — Other Ambulatory Visit: Payer: Self-pay | Admitting: Neurology

## 2023-06-29 ENCOUNTER — Other Ambulatory Visit: Payer: Self-pay | Admitting: Nurse Practitioner

## 2023-06-29 MED ORDER — POTASSIUM CHLORIDE CRYS ER 10 MEQ PO TBCR: 10.0000 meq | EXTENDED_RELEASE_TABLET | Freq: Two times a day (BID) | ORAL | 0 refills | Status: DC

## 2023-07-06 ENCOUNTER — Other Ambulatory Visit: Payer: Self-pay | Admitting: Family Medicine

## 2023-07-20 ENCOUNTER — Ambulatory Visit (INDEPENDENT_AMBULATORY_CARE_PROVIDER_SITE_OTHER): Payer: Medicare Other | Admitting: Nurse Practitioner

## 2023-07-20 ENCOUNTER — Encounter: Payer: Self-pay | Admitting: Nurse Practitioner

## 2023-07-20 VITALS — BP 126/68 | HR 65 | Temp 97.6°F | Wt 202.2 lb

## 2023-07-20 DIAGNOSIS — J019 Acute sinusitis, unspecified: Secondary | ICD-10-CM | POA: Diagnosis not present

## 2023-07-20 DIAGNOSIS — B9689 Other specified bacterial agents as the cause of diseases classified elsewhere: Secondary | ICD-10-CM

## 2023-07-20 DIAGNOSIS — N390 Urinary tract infection, site not specified: Secondary | ICD-10-CM | POA: Diagnosis not present

## 2023-07-20 MED ORDER — CEFDINIR 300 MG PO CAPS
300.0000 mg | ORAL_CAPSULE | Freq: Two times a day (BID) | ORAL | 0 refills | Status: DC
Start: 2023-07-20 — End: 2023-08-31

## 2023-07-20 NOTE — Progress Notes (Signed)
   Subjective:    Patient ID: Heather Fleming, female    DOB: 06-Mar-1951, 72 y.o.   MRN: [id number]  HPI Presents for complaints of frontal sinus congestion that began 3 days ago.  Had 2 days of intense sinus pressure while she was at the beach back in earlier October.  Symptoms were better until Friday.  Frontal area headache.  Ear pressure and popping.  Sore throat.  No fever.  No chest pain shortness of breath or wheezing.  Has used Flonase and simply saline which has helped some.  Now producing greenish to gray mucus especially in the mornings. Of note patient would like to a referral to a different urologist for consultation.  Has had recurrent UTIs and difficulty communicating with her current urologist.   Review of Systems  Constitutional:  Negative for fever.  HENT:  Positive for congestion, sinus pressure, sinus pain and sore throat. Negative for ear pain.   Respiratory:  Positive for cough. Negative for chest tightness, shortness of breath and wheezing.   Cardiovascular:  Negative for chest pain.       Objective:   Physical Exam NAD.  Alert, oriented.  Right TM clear effusion, no erythema.  Left TM retracted, no erythema.  Nasal mucosa clear.  Pharynx mildly injected, otherwise clear.  Neck supple with mild soft anterior cervical adenopathy.  Lungs clear.  Heart regular rate rhythm. Today's Vitals   07/20/23 1601  BP: 126/68  Pulse: 65  Temp: 97.6 F (36.4 C)  TempSrc: Temporal  SpO2: 97%  Weight: 202 lb 3.2 oz (91.7 kg)   Body mass index is 34.71 kg/m.        Assessment & Plan:   Problem List Items Addressed This Visit       Genitourinary   Recurrent UTI   Relevant Medications   cefdinir (OMNICEF) 300 MG capsule   Other Relevant Orders   Ambulatory referral to Urology   Other Visit Diagnoses     Acute bacterial rhinosinusitis    -  Primary   Relevant Medications   cefdinir (OMNICEF) 300 MG capsule      Meds ordered this encounter  Medications    cefdinir (OMNICEF) 300 MG capsule    Sig: Take 1 capsule (300 mg total) by mouth 2 (two) times daily.    Dispense:  14 capsule    Refill:  0    Order Specific Question:   Supervising Provider    Answer:   Babs Sciara [[id number]]   Start Omnicef as directed.  Continue OTC treatments as directed.  Call back in 7 to 10 days if no improvement, sooner if worse.  Referral also placed to local urologist in [place], [place].

## 2023-07-27 ENCOUNTER — Encounter: Payer: Self-pay | Admitting: Nurse Practitioner

## 2023-08-11 ENCOUNTER — Telehealth: Payer: Self-pay

## 2023-08-11 ENCOUNTER — Other Ambulatory Visit: Payer: Self-pay | Admitting: Family Medicine

## 2023-08-11 MED ORDER — LEVOCETIRIZINE DIHYDROCHLORIDE 5 MG PO TABS: 5.0000 mg | ORAL_TABLET | Freq: Every evening | ORAL | 0 refills | Status: DC

## 2023-08-11 MED ORDER — FLUTICASONE PROPIONATE 50 MCG/ACT NA SUSP: 2.0000 | Freq: Every day | NASAL | 0 refills | Status: DC

## 2023-08-11 MED ORDER — POTASSIUM CHLORIDE CRYS ER 10 MEQ PO TBCR: 10.0000 meq | EXTENDED_RELEASE_TABLET | Freq: Two times a day (BID) | ORAL | 0 refills | Status: DC

## 2023-08-11 NOTE — Telephone Encounter (Signed)
Pt is wanting to cancel RX request for [facility] call and asked to send a message back

## 2023-08-11 NOTE — Telephone Encounter (Signed)
Source  Heather Fleming, Heather Fleming (Patient)   Subject  Heather Fleming, Heather Fleming (Patient)   Topic  Clinical - Medication Refill    Communication  Most Recent Primary Care Visit:  Provider: Campbell Riches     Department: [facility]     Visit Type: OFFICE VISIT     Date: 07/20/2023        Medication: omeprazole (PRILOSEC) 20 MG capsule        Has the patient contacted their pharmacy? Yes.    (Agent: If no, request that the patient contact the pharmacy for the refill. If patient does not wish to contact the pharmacy document the reason why and proceed with request.)    (Agent: If yes, when and what did the pharmacy advise?)        Is this the correct pharmacy for this prescription? Yes    If no, delete pharmacy and type the correct one.    This is the patient's preferred pharmacy:    [facility] [place] - [address]    [address]    [address]    [place] [address]    Phone: [phone] Fax: [phone]        [facility] [place], [place] - [address]    [address]    [place] [address]    Phone: [phone] Fax: [phone]            Has the prescription been filled recently? No        Is the patient out of the medication? Yes, She would like to know if there is a way for [place] to send a small prescription to [facility] to hold her over, while we send a full prescription to [facility] for delivery.        Has the patient been seen for an appointment in the last year OR does the patient have an upcoming appointment? Yes        Can we respond through MyChart? Yes        Agent: Please be advised that Rx refills may take up to 3 business days. We ask that you follow-up with your pharmacy.

## 2023-08-12 DIAGNOSIS — N3021 Other chronic cystitis with hematuria: Secondary | ICD-10-CM | POA: Diagnosis not present

## 2023-08-17 DIAGNOSIS — N3021 Other chronic cystitis with hematuria: Secondary | ICD-10-CM | POA: Diagnosis not present

## 2023-08-31 ENCOUNTER — Ambulatory Visit: Payer: Medicare Other | Admitting: Nurse Practitioner

## 2023-08-31 VITALS — BP 134/78 | HR 63 | Temp 97.2°F | Ht 64.0 in | Wt 200.0 lb

## 2023-08-31 DIAGNOSIS — J069 Acute upper respiratory infection, unspecified: Secondary | ICD-10-CM | POA: Diagnosis not present

## 2023-08-31 DIAGNOSIS — G43809 Other migraine, not intractable, without status migrainosus: Secondary | ICD-10-CM | POA: Diagnosis not present

## 2023-08-31 MED ORDER — PREDNISONE 20 MG PO TABS: ORAL_TABLET | ORAL | 0 refills | Status: DC

## 2023-09-01 ENCOUNTER — Encounter: Payer: Self-pay | Admitting: Nurse Practitioner

## 2023-09-01 NOTE — Progress Notes (Signed)
   Subjective:    Patient ID: Heather Fleming, female    DOB: June 18, 1951, 72 y.o.   MRN: [id number]  HPI Presents for recheck of sinus congestion.  For about a week has had pressure in the mid forehead between the eyebrows.  Developed a pounding headache, much better.  Has been using saline rinses producing some scabs and mucus.  Still has pressure but overall improved.  No fever.  Ear pressure.  No sore throat.  Minimal cough.  Has a history of migraine headaches.  Was seen for bacterial rhinosinusitis on 07/20/2023   Review of Systems  Constitutional:  Negative for fever.  HENT:  Positive for congestion, postnasal drip, sinus pressure and sinus pain. Negative for ear pain and sore throat.   Respiratory:  Negative for cough, chest tightness, shortness of breath and wheezing.   Cardiovascular:  Negative for chest pain.  Neurological:  Positive for headaches.   Social History   Tobacco Use   Smoking status: Never   Smokeless tobacco: Never  Vaping Use   Vaping status: Never Used  Substance Use Topics   Alcohol use: Yes    Alcohol/week: 3.0 standard drinks of alcohol    Types: 3 Cans of beer per week    Comment: occasionally   Drug use: Never        Objective:   Physical Exam NAD.  Alert, oriented.  TMs clear effusion, no erythema.  Pharynx clear and moist.  Neck supple with mild soft anterior cervical adenopathy.  Lungs clear.  Heart regular rate rhythm. Today's Vitals   08/31/23 1433  BP: 134/78  Pulse: 63  Temp: (!) 97.2 F (36.2 C)  SpO2: 95%  Weight: 200 lb (90.7 kg)  Height: 5\' 4"  (1.626 m)   Body mass index is 34.33 kg/m.        Assessment & Plan:   Problem List Items Addressed This Visit       Cardiovascular and Mediastinum   Migraine   Other Visit Diagnoses     Viral URI    -  Primary      Congestion most likely viral etiology with worsening due to weather. Meds ordered this encounter  Medications   predniSONE (DELTASONE) 20 MG tablet     Sig: Take 2 tabs po once daily x 5 days    Dispense:  10 tablet    Refill:  0    Order Specific Question:   Supervising Provider    Answer:   Lilyan Punt A [[id number]]   No antibiotics indicated at this time.  Also headache has improved.  Short course of prednisone to see if this will relieve her sinus pressure.  Also based on her symptoms, feel that patient had a flareup of her migraines.  Will hold on any therapy for migraines at this point, due to her age and history, consider CGRP if abortive therapy is needed.  Call back next week if no improvement, sooner if worse.

## 2023-09-10 ENCOUNTER — Ambulatory Visit: Payer: Medicare Other | Admitting: Family Medicine

## 2023-09-10 VITALS — BP 124/64 | HR 68 | Temp 97.2°F | Ht 64.0 in | Wt 201.0 lb

## 2023-09-10 DIAGNOSIS — H93A9 Pulsatile tinnitus, unspecified ear: Secondary | ICD-10-CM | POA: Diagnosis not present

## 2023-09-10 DIAGNOSIS — I10 Essential (primary) hypertension: Secondary | ICD-10-CM

## 2023-09-10 DIAGNOSIS — G609 Hereditary and idiopathic neuropathy, unspecified: Secondary | ICD-10-CM | POA: Diagnosis not present

## 2023-09-10 MED ORDER — PREGABALIN 75 MG PO CAPS: 75.0000 mg | ORAL_CAPSULE | Freq: Two times a day (BID) | ORAL | 1 refills | Status: DC

## 2023-09-10 MED ORDER — POTASSIUM CHLORIDE CRYS ER 10 MEQ PO TBCR: 10.0000 meq | EXTENDED_RELEASE_TABLET | Freq: Two times a day (BID) | ORAL | 0 refills | Status: DC

## 2023-09-10 MED ORDER — OMEPRAZOLE 20 MG PO CPDR: 20.0000 mg | DELAYED_RELEASE_CAPSULE | Freq: Every day | ORAL | 1 refills | Status: DC
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
## 2023-09-10 NOTE — Assessment & Plan Note (Signed)
Stable on Lyrica.  Continue.  Refilled today.

## 2023-09-10 NOTE — Assessment & Plan Note (Signed)
Left sided.  Discussed this today.  She will follow-up in 3 months.  If becomes persistent, we will proceed with ENT referral.

## 2023-09-10 NOTE — Assessment & Plan Note (Addendum)
Stable.  Continue valsartan/HCTZ.

## 2023-09-10 NOTE — Patient Instructions (Signed)
Meds refilled.  If the problem persists, please let me know.  Follow up in 3 months.

## 2023-09-24 ENCOUNTER — Other Ambulatory Visit: Payer: Self-pay | Admitting: Urology

## 2023-09-24 ENCOUNTER — Encounter (HOSPITAL_BASED_OUTPATIENT_CLINIC_OR_DEPARTMENT_OTHER): Payer: Self-pay | Admitting: Urology

## 2023-09-24 NOTE — Progress Notes (Signed)
 Spoke w/ via phone for pre-op interview--- Heather Fleming needs dos---- ISTAT per anesthesia.        Lab results------EKG in Epic dated 04/03/23. COVID test -----patient states asymptomatic no test needed Arrive at -------0945 NPO after MN NO Solid Food.  Clear liquids from MN until---0845 Med rec completed Medications to take morning of surgery -----Celexa , Prilosec, Propranolol  and Lyrica . Diabetic medication ----- Patient instructed no nail polish to be worn day of surgery Patient instructed to bring photo id and insurance card day of surgery Patient aware to have Driver (ride ) / caregiver    for 24 hours after surgery - Husband Heather Fleming. Patient Special Instructions ----- Pre-Op special Instructions ----- Patient verbalized understanding of instructions that were given at this phone interview. Patient denies chest pain, sob, fever, cough at the interview.

## 2023-09-25 ENCOUNTER — Other Ambulatory Visit: Payer: Self-pay | Admitting: Urology

## 2023-09-25 NOTE — H&P (Signed)
 HPI: The patient is a 73 year old female with a history of recurrent urinary tract infections   -History of nepohrolithiasis: denies  -Dysparunia: Not sexually active  -Gravida/Para: G1P1 (vaginal delivery)  -Estrogen status: Post-menopausal. Still has uterus and ovaries. Not on estrogen/progesterone supplementation.   07/18/19: Heather Fleming is here today for a routine f/u. She denies interval UTIs, dysuria hematuria. UA is clear today. She continues to have occasional episodes of mixed incontinence, but isn't interested in PT or OAB treatment.   10/01/20: The patient is here today for a routine follow-up. She reports 2 suspected UTIs over the past 12 months, which is markedly improved for her. No urine culture data available--treated with cipro  with resolution of her sxs. She continues to have sporadic episodes of SUI and wears protective pad when she goes out in public. Pt states that she has a BM every other day with varying firmness. Overall, doing well and is please with her progress.   02/12/21: 73 year old female who presents today with concerns of recurring UTIs. She reports that her symptoms generally include burning with urination and increased frequency. she does endorses issues with constipation as well. She denies fevers, chills and gross hematuria. urine is clear today.   01/19/23: The patient is here today for a routine follow-up. She has been seen by Dr. Sherrilee in the interim and was placed on daily macrobid . She notes sporadic UTIs over the past several months. Wears a pad due to sporadic episodes of SUI--wearing 2-3 ppd that are rarely soaked.   08/12/23: Heather Fleming returns today with progressive incontinence and recurrent UTI's. She is on D-mannose and topical estrogen. Two weeks ago she had pain and pressure and took Azo and macrobid  which she stopped Saturday and her symptoms have resolved. Her UA is clear.   08/17/2023: 73 year old woman referred by Dr. Watt for stress urinary  incontinence. She has a history of recurrent UTIs and is currently using d-mannose, bladder probiotic estradiol. She was using nitrofurantoin  for 3 months but is since stopped this. She has to wear a moderately thick pad all the time for stress urinary continence. It is worse with UTIs. Pelvic exam showed a rectocele and leakage with cough in the dorsolithotomy position.     ALLERGIES: Artificial Sweetners Codeine Latex    MEDICATIONS: Estrace 0.01 % cream with applicator Apply a pea-sized amnt around the urethra & vaginal opening once daily for 2 weeks, then transition to every other day.  Macrobid  100 mg capsule 1 capsule PO BID  Omeprazole  20 mg capsule,delayed release  Aspirin Ec 81 mg tablet, delayed release  Atorvastatin  Calcium  40 mg tablet  Azo  Baclofen 20 mg tablet  Celexa  20 mg tablet  Cranberry  D-Mannose  Flonase  Allergy  Relief  Losartan-Hydrochlorothiazide   Lyrica   Probiotics  Propranolol  Hcl 40 mg tablet  Singulair  10 mg tablet  Xyzal  5 mg tablet     GU PSH: No GU PSH    NON-GU PSH: Visit [facility] ([facility]) - 08/12/2023     GU PMH: Chronic cystitis (with hematuria), I am going to give her macrobid  to keep on hand for symptomatic flairs. - 08/12/2023, - 01/19/2023, - 2022, - 2022, - 2020 Postmenopausal atrophic vaginitis, Continue Estrace. She has mild atrophy and a small rectocele. - 08/12/2023 Stress Incontinence, She has progressive SUI. I will have her see Dr. Elisabeth for consideration of Bulkamid. - 08/12/2023, - 01/19/2023    NON-GU PMH: No Non-GU PMH    FAMILY HISTORY: No Family History  SOCIAL HISTORY: No Social History    REVIEW OF SYSTEMS:    GU Review Female:   Patient denies frequent urination, hard to postpone urination, burning /pain with urination, get up at night to urinate, leakage of urine, stream starts and stops, trouble starting your stream, have to strain to urinate, and being pregnant.  Gastrointestinal (Upper):   Patient denies  nausea, vomiting, and indigestion/ heartburn.  Gastrointestinal (Lower):   Patient denies diarrhea and constipation.  Constitutional:   Patient denies fever, night sweats, weight loss, and fatigue.  Skin:   Patient denies skin rash/ lesion and itching.  Eyes:   Patient denies blurred vision and double vision.  Ears/ Nose/ Throat:   Patient denies sore throat and sinus problems.  Hematologic/Lymphatic:   Patient denies swollen glands and easy bruising.  Cardiovascular:   Patient denies leg swelling and chest pains.  Respiratory:   Patient denies cough and shortness of breath.  Endocrine:   Patient denies excessive thirst.  Musculoskeletal:   Patient denies back pain and joint pain.  Neurological:   Patient denies headaches and dizziness.  Psychologic:   Patient denies depression and anxiety.   VITAL SIGNS: None   MULTI-SYSTEM PHYSICAL EXAMINATION:    Constitutional: Well-nourished. No physical deformities. Normally developed. Good grooming.  Neck: Neck symmetrical, not swollen. Normal tracheal position.  Respiratory: No labored breathing, no use of accessory muscles.   Skin: No paleness, no jaundice, no cyanosis. No lesion, no ulcer, no rash.  Neurologic / Psychiatric: Oriented to time, oriented to place, oriented to person. No depression, no anxiety, no agitation.  Eyes: Normal conjunctivae. Normal eyelids.  Ears, Nose, Mouth, and Throat: Left ear no scars, no lesions, no masses. Right ear no scars, no lesions, no masses. Nose no scars, no lesions, no masses. Normal hearing. Normal lips.  Musculoskeletal: Normal gait and station of head and neck.     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 Chronic cystitis: Patient to continue vaginal estradiol, d-mannose, and bladder probiotic. We can consider restarting suppressive antibiotic therapy or adding methenamine and vitamin C if she has breakthrough UTIs.   2. Stress urinary incontinence:  -Patient with evidence of leakage with cough in dorsolithotomy position  -We discussed options for treatment including Bulkamid versus mid urethral sling  -Patient is interested in moving forward with Bulkamid  -Risks and benefits of procedure discussed with pt including pain, bleeding, urinary retention (less than 10%), need for a  second procedure, persistent leakage   Schedule next available Bulkamid date

## 2023-09-29 ENCOUNTER — Other Ambulatory Visit: Payer: Self-pay

## 2023-09-29 ENCOUNTER — Ambulatory Visit (HOSPITAL_BASED_OUTPATIENT_CLINIC_OR_DEPARTMENT_OTHER): Payer: Medicare Other | Admitting: Anesthesiology

## 2023-09-29 ENCOUNTER — Emergency Department (HOSPITAL_COMMUNITY)
Admission: EM | Admit: 2023-09-29 | Discharge: 2023-09-30 | Disposition: A | Discharge status: Home / Self Care | Payer: Medicare Other | Source: Home / Self Care | Attending: Emergency Medicine | Admitting: Emergency Medicine

## 2023-09-29 ENCOUNTER — Encounter (HOSPITAL_COMMUNITY): Payer: Self-pay | Admitting: Emergency Medicine

## 2023-09-29 ENCOUNTER — Encounter (HOSPITAL_BASED_OUTPATIENT_CLINIC_OR_DEPARTMENT_OTHER): Payer: Self-pay | Admitting: Urology

## 2023-09-29 ENCOUNTER — Encounter (HOSPITAL_BASED_OUTPATIENT_CLINIC_OR_DEPARTMENT_OTHER)
Admission: RE | Disposition: A | Discharge status: Home / Self Care | Payer: Self-pay | Source: Home / Self Care | Attending: Urology

## 2023-09-29 ENCOUNTER — Ambulatory Visit (HOSPITAL_BASED_OUTPATIENT_CLINIC_OR_DEPARTMENT_OTHER)
Admission: RE | Admit: 2023-09-29 | Discharge: 2023-09-29 | Disposition: A | Discharge status: Home / Self Care | Payer: Medicare Other | Attending: Urology | Admitting: Urology

## 2023-09-29 DIAGNOSIS — N393 Stress incontinence (female) (male): Secondary | ICD-10-CM | POA: Diagnosis not present

## 2023-09-29 DIAGNOSIS — E119 Type 2 diabetes mellitus without complications: Secondary | ICD-10-CM | POA: Insufficient documentation

## 2023-09-29 DIAGNOSIS — F32A Depression, unspecified: Secondary | ICD-10-CM | POA: Diagnosis not present

## 2023-09-29 DIAGNOSIS — Z7982 Long term (current) use of aspirin: Secondary | ICD-10-CM | POA: Insufficient documentation

## 2023-09-29 DIAGNOSIS — Z79899 Other long term (current) drug therapy: Secondary | ICD-10-CM | POA: Insufficient documentation

## 2023-09-29 DIAGNOSIS — Z87440 Personal history of urinary (tract) infections: Secondary | ICD-10-CM | POA: Diagnosis not present

## 2023-09-29 DIAGNOSIS — N3021 Other chronic cystitis with hematuria: Secondary | ICD-10-CM | POA: Diagnosis not present

## 2023-09-29 DIAGNOSIS — I10 Essential (primary) hypertension: Secondary | ICD-10-CM | POA: Insufficient documentation

## 2023-09-29 DIAGNOSIS — R339 Retention of urine, unspecified: Secondary | ICD-10-CM | POA: Insufficient documentation

## 2023-09-29 DIAGNOSIS — F419 Anxiety disorder, unspecified: Secondary | ICD-10-CM | POA: Insufficient documentation

## 2023-09-29 DIAGNOSIS — N952 Postmenopausal atrophic vaginitis: Secondary | ICD-10-CM | POA: Diagnosis not present

## 2023-09-29 DIAGNOSIS — Z91040 Latex allergy status: Secondary | ICD-10-CM | POA: Insufficient documentation

## 2023-09-29 DIAGNOSIS — K219 Gastro-esophageal reflux disease without esophagitis: Secondary | ICD-10-CM | POA: Diagnosis not present

## 2023-09-29 DIAGNOSIS — N3946 Mixed incontinence: Secondary | ICD-10-CM | POA: Insufficient documentation

## 2023-09-29 HISTORY — PX: CYSTOSCOPY: SHX5120

## 2023-09-29 LAB — POCT I-STAT, CHEM 8
BUN: 11 mg/dL (ref 8–23)
Calcium, Ion: 1.22 mmol/L (ref 1.15–1.40)
Chloride: 100 mmol/L (ref 98–111)
Creatinine, Ser: 0.9 mg/dL (ref 0.44–1.00)
Glucose, Bld: 105 mg/dL — ABNORMAL HIGH (ref 70–99)
HCT: 42 % (ref 36.0–46.0)
Hemoglobin: 14.3 g/dL (ref 12.0–15.0)
Potassium: 3.8 mmol/L (ref 3.5–5.1)
Sodium: 137 mmol/L (ref 135–145)
TCO2: 25 mmol/L (ref 22–32)

## 2023-09-29 LAB — GLUCOSE, CAPILLARY: Glucose-Capillary: 112 mg/dL — ABNORMAL HIGH (ref 70–99)

## 2023-09-29 SURGERY — CYSTOSCOPY
Anesthesia: Monitor Anesthesia Care | Site: Urethra

## 2023-09-29 MED ORDER — SODIUM CHLORIDE 0.9 % IV SOLN: INTRAVENOUS | Status: DC

## 2023-09-29 MED ORDER — WATER FOR IRRIGATION, STERILE IR SOLN
Status: DC | PRN
  Administered 2023-09-29: 3000 mL via URETHRAL

## 2023-09-29 MED ORDER — FENTANYL CITRATE (PF) 100 MCG/2ML IJ SOLN
INTRAMUSCULAR | Status: AC
  Filled 2023-09-29: qty 2

## 2023-09-29 MED ORDER — ACETAMINOPHEN 500 MG PO TABS
1000.0000 mg | ORAL_TABLET | Freq: Once | ORAL | Status: AC
Start: 2023-09-29 — End: 2023-09-29
  Administered 2023-09-29: 1000 mg via ORAL

## 2023-09-29 MED ORDER — PROPOFOL 500 MG/50ML IV EMUL
INTRAVENOUS | Status: DC | PRN
  Administered 2023-09-29: 150 ug/kg/min via INTRAVENOUS

## 2023-09-29 MED ORDER — DEXMEDETOMIDINE HCL IN NACL 80 MCG/20ML IV SOLN
INTRAVENOUS | Status: AC
  Filled 2023-09-29: qty 20

## 2023-09-29 MED ORDER — ACETAMINOPHEN 500 MG PO TABS
ORAL_TABLET | ORAL | Status: AC
  Filled 2023-09-29: qty 2

## 2023-09-29 MED ORDER — FENTANYL CITRATE (PF) 100 MCG/2ML IJ SOLN: 25.0000 ug | INTRAMUSCULAR | Status: DC | PRN

## 2023-09-29 MED ORDER — FENTANYL CITRATE (PF) 100 MCG/2ML IJ SOLN
INTRAMUSCULAR | Status: DC | PRN
  Administered 2023-09-29: 25 ug via INTRAVENOUS

## 2023-09-29 MED ORDER — LIDOCAINE HCL (CARDIAC) PF 100 MG/5ML IV SOSY
PREFILLED_SYRINGE | INTRAVENOUS | Status: DC | PRN
  Administered 2023-09-29: 50 mg via INTRAVENOUS

## 2023-09-29 MED ORDER — ONDANSETRON HCL 4 MG/2ML IJ SOLN
INTRAMUSCULAR | Status: DC | PRN
  Administered 2023-09-29: 4 mg via INTRAVENOUS

## 2023-09-29 MED ORDER — CIPROFLOXACIN IN D5W 400 MG/200ML IV SOLN
400.0000 mg | INTRAVENOUS | Status: AC
  Administered 2023-09-29: 400 mg via INTRAVENOUS

## 2023-09-29 MED ORDER — CIPROFLOXACIN IN D5W 400 MG/200ML IV SOLN
INTRAVENOUS | Status: AC
  Filled 2023-09-29: qty 200

## 2023-09-29 SURGICAL SUPPLY — 14 items
BAG DRAIN URO-CYSTO SKYTR STRL (DRAIN) ×1 IMPLANT
CLOTH BEACON ORANGE TIMEOUT ST (SAFETY) ×1 IMPLANT
GLOVE SURG SS PI 6.5 STRL IVOR (GLOVE) IMPLANT
GLOVE SURG SYN 6.5 ES PF (GLOVE) ×2
GLOVE SURG SYN 6.5 PF PI (GLOVE) IMPLANT
GOWN STRL REUS W/ TWL LRG LVL3 (GOWN DISPOSABLE) IMPLANT
GOWN STRL REUS W/TWL LRG LVL3 (GOWN DISPOSABLE) ×1 IMPLANT
KIT TURNOVER CYSTO (KITS) ×1 IMPLANT
MANIFOLD NEPTUNE II (INSTRUMENTS) ×1 IMPLANT
PACK CYSTO (CUSTOM PROCEDURE TRAY) ×1 IMPLANT
SLEEVE SCD COMPRESS KNEE MED (STOCKING) ×1 IMPLANT
SYSTEM URETHRAL BULK BULKAMID (Female Continence) IMPLANT
TUBE CONNECTING 12X1/4 (SUCTIONS) ×1 IMPLANT
WATER STERILE IRR 3000ML UROMA (IV SOLUTION) ×1 IMPLANT

## 2023-09-29 NOTE — Discharge Instructions (Addendum)
Cystoscopy with Bulkamid patient instructions  Following a cystoscopy, a catheter (a flexible rubber tube) is sometimes left in place to empty the bladder. This may cause some discomfort or a feeling that you need to urinate. Your doctor determines the period of time that the catheter will be left in place. You may have bloody urine for two to three days (Call your doctor if the amount of bleeding increases or does not subside).  You may pass blood clots in your urine, especially if you had a biopsy. It is not unusual to pass small blood clots and have some bloody urine a couple of weeks after your cystoscopy. Again, call your doctor if the bleeding does not subside. You may have: Dysuria (painful urination) Frequency (urinating often) Urgency (strong desire to urinate)  These symptoms are common especially if medicine is instilled into the bladder or a ureteral stent is placed. Avoiding alcohol and caffeine, such as coffee, tea, and chocolate, may help relieve these symptoms. Drink plenty of water, unless otherwise instructed. Your doctor may also prescribe an antibiotic or other medicine to reduce these symptoms.  Cystoscopy results are available soon after the procedure; biopsy results usually take two to four days. Your doctor will discuss the results of your exam with you. Before you go home, you will be given specific instructions for follow-up care. Special Instructions:   If you are going home with a catheter in place do not take a tub bath until removed by your doctor.   You may resume your normal activities.   Do not drive or operate machinery if you are taking narcotic pain medicine.   Be sure to keep all follow-up appointments with your doctor.   Call Your Doctor If: The catheter is not draining You have severe pain You are unable to urinate You have a fever over 101 You have severe bleeding          Post Anesthesia Home Care Instructions  Activity: Get plenty of rest for  the remainder of the day. A responsible adult should stay with you for 24 hours following the procedure.  For the next 24 hours, DO NOT: -Drive a car -Advertising copywriter -Drink alcoholic beverages -Take any medication unless instructed by your physician -Make any legal decisions or sign important papers.  Meals: Start with liquid foods such as gelatin or soup. Progress to regular foods as tolerated. Avoid greasy, spicy, heavy foods. If nausea and/or vomiting occur, drink only clear liquids until the nausea and/or vomiting subsides. Call your physician if vomiting continues.  Special Instructions/Symptoms: Your throat may feel dry or sore from the anesthesia or the breathing tube placed in your throat during surgery. If this causes discomfort, gargle with warm salt water. The discomfort should disappear within 24 hours.

## 2023-09-29 NOTE — Anesthesia Postprocedure Evaluation (Signed)
 Anesthesia Post Note  Patient: Heather Fleming  Procedure(s) Performed: CYSTOSCOPY (Urethra) URETHRAL BULKING (Urethra)     Patient location during evaluation: PACU Anesthesia Type: MAC Level of consciousness: awake and alert Pain management: pain level controlled Vital Signs Assessment: post-procedure vital signs reviewed and stable Respiratory status: spontaneous breathing, nonlabored ventilation and respiratory function stable Cardiovascular status: stable and blood pressure returned to baseline Postop Assessment: no apparent nausea or vomiting Anesthetic complications: no  No notable events documented.  Last Vitals:  Vitals:   09/29/23 1017 09/29/23 1156  BP: (!) 148/63 (!) 113/47  Pulse: 62 68  Resp: 17 16  Temp: 36.7 C (!) 36.3 C  SpO2: 99% 95%    Last Pain:  Vitals:   09/29/23 1156  TempSrc:   PainSc: 2                  Tylea Hise,W. EDMOND

## 2023-09-29 NOTE — Interval H&P Note (Signed)
 History and Physical Interval Note:  09/29/2023 10:49 AM  Heather Fleming  has presented today for surgery, with the diagnosis of STRESS URINARY INCONTINENCE.  The various methods of treatment have been discussed with the patient and family. After consideration of risks, benefits and other options for treatment, the patient has consented to  Procedure(s) with comments: CYSTOSCOPY (N/A) - 30 MINUTE CASE URETHRAL BULKING (N/A) as a surgical intervention.  The patient's history has been reviewed, patient examined, no change in status, stable for surgery.  I have reviewed the patient's chart and labs.  Questions were answered to the patient's satisfaction.     [name] D Arish Redner

## 2023-09-29 NOTE — Op Note (Signed)
 Operative Note   Preoperative diagnosis:  1.  Stress urinary incontinence   Postoperative diagnosis: 1.  Stress urinary incontinence   Procedure(s): 1.  Cystoscopy with injection of bulkamid   Surgeon: Valli Shank, MD   Assistants:  None   Anesthesia:  General   Complications:  None   EBL:  minimal   Specimens: 1. none   Drains/Catheters: 1.  none   Intraoperative findings:   Normal urethra   Indication:  73 yo woman with symptomatic stress urinary incontinence.   Description of procedure:   After risks and benefits of the procedure discussed with the patient, informed consent was obtained.  The patient was taken to the operating placed in the supine position.  Anesthesia was induced and antibiotics were administered.  The patient was then repositioned in the dorsolithotomy position.  She was prepped and draped in usual sterile fashion a timeout performed with the attending present.  The cystoscope was assembled with the Bulkamid system.  It was then placed in the urethral meatus and advanced into the bladder under direct visualization.  Prior cystoscopy had been done which noted normal anatomic landmarks.  These were again verified during cystoscopy today.  The cystoscope was brought back to the bladder neck and the needle was advanced through the needle guide at the 1 o'clock position.  Once it was visualized and advanced it was rotated to the 5 o'clock position.  Bulkamid was then injected until a bleb was seen.  This was then repeated at the 1 o'clock position in the 7 o'clock position until coaptation was noted.   This concluded the case.  The patient's bladder was left with approximately 200 cc of sterile saline.  The patient emerged from anesthesia and was transferred the PACU in stable condition.   Plan:  Plan for patient to void in PACU prior to discharge.

## 2023-09-29 NOTE — ED Triage Notes (Addendum)
 Pt in with urinary retention after ureter bulking procedure this morning. Pt states during post-op at 1230, she felt urinary pressure, believed she urinated but knows she passed large clots while trying to void. Pt states she has not urinated since, is also reporting frequent diarrhea. Bladder scan >559ml

## 2023-09-29 NOTE — ED Provider Notes (Signed)
 [place] EMERGENCY DEPARTMENT AT [facility] Provider Note   CSN: [id number] Arrival date & time: 09/29/23  2233     History  Chief Complaint  Patient presents with   Urinary Retention    Heather Fleming is a 73 y.o. female.  The history is provided by the patient.  She has history of hypertension, hyperlipidemia and comes in because of inability to urinate.  She had a urethral bulking procedure earlier today and was able to urinate following the procedure, but has not been able to urinate since noon.  Last time she urinated, she did pass some clots.  She denies fever or chills.   Home Medications Prior to Admission medications   Medication Sig Start Date End Date Taking? Authorizing Provider  aspirin 81 MG chewable tablet Chew 81 mg by mouth daily.    [provider]  atorvastatin  (LIPITOR) 40 MG tablet TAKE 1 TABLET BY MOUTH DAILY 07/06/23   Cook, Jayce G, DO  baclofen (LIORESAL) 20 MG tablet Take 20 mg by mouth daily. 10mg  every other night    [provider]  calcium  carbonate (OS-CAL) 1250 (500 Ca) MG chewable tablet Chew 1 tablet by mouth daily.    [provider]  citalopram  (CELEXA ) 20 MG tablet Take 1 tablet (20 mg total) by mouth daily. 12/05/22   Cook, Jayce G, DO  Cranberry-Vitamin C-Probiotic (AZO CRANBERRY PO) Take by mouth.    [provider]  D-MANNOSE PO Take by mouth. For bladder    [provider]  estradiol (ESTRACE) 0.1 MG/GM vaginal cream Place 1 Applicatorful vaginally at bedtime.    [provider]  fluticasone  (FLONASE ) 50 MCG/ACT nasal spray Place 2 sprays into both nostrils daily. 08/11/23   Cook, Jayce G, DO  levocetirizine (XYZAL ) 5 MG tablet Take 1 tablet (5 mg total) by mouth every evening. 08/11/23   Cook, Jayce G, DO  montelukast  (SINGULAIR ) 10 MG tablet TAKE 1 TABLET BY MOUTH AT  BEDTIME 05/04/23   Cook, Jayce G, DO  Multiple Vitamins-Minerals (HAIR SKIN AND NAILS FORMULA PO) Take  by mouth.    [provider]  nitrofurantoin , macrocrystal-monohydrate, (MACROBID ) 100 MG capsule Take 100 mg by mouth at bedtime.    [provider]  NON FORMULARY healthy feet and nerves    [provider]  omeprazole  (PRILOSEC) 20 MG capsule Take 1 capsule (20 mg total) by mouth daily. 09/10/23   Cook, Jayce G, DO  potassium chloride  (KLOR-CON  M) 10 MEQ tablet Take 1 tablet (10 mEq total) by mouth 2 (two) times daily. 09/10/23   Cook, Jayce G, DO  pregabalin  (LYRICA ) 75 MG capsule Take 1 capsule (75 mg total) by mouth 2 (two) times daily. 09/10/23   Cook, Jayce G, DO  propranolol  (INDERAL ) 40 MG tablet Take 1 tablet (40 mg total) by mouth 2 (two) times daily. 06/04/23   Skeet Juliene SAUNDERS, DO  valsartan -hydrochlorothiazide  (DIOVAN -HCT) 160-25 MG tablet Take 1 tablet by mouth daily. 12/05/22   Cook, Jayce G, DO      Allergies    Augmentin [amoxicillin-pot clavulanate], Codeine, Other, Azithromycin, and Latex    Review of Systems   Review of Systems  All other systems reviewed and are negative.   Physical Exam Updated Vital Signs BP (!) 160/68 (BP Location: Right Arm)   Pulse 64   Temp 97.8 F (36.6 C) (Oral)   Resp 17   Wt 90.7 kg   SpO2 97%   BMI 34.33 kg/m  Physical  Exam Vitals and nursing note reviewed.   73 year old female, resting comfortably and in no acute distress. Vital signs are significant for elevated blood pressure. Oxygen saturation is 97%, which is normal. Head is normocephalic and atraumatic. PERRLA, EOMI. Lungs are clear without rales, wheezes, or rhonchi. Chest is nontender. Heart has regular rate and rhythm without murmur. Abdomen is soft, flat, nontender.  Bladder is moderately distended. Neurologic: Mental status is normal, moves all extremities equally.  ED Results / Procedures / Treatments   Labs (all labs ordered are listed, but only abnormal results are displayed) Labs Reviewed  URINALYSIS, ROUTINE W REFLEX MICROSCOPIC -  Abnormal; Notable for the following components:      Result Value   APPearance HAZY (*)    Hgb urine dipstick MODERATE (*)    All other components within normal limits   Procedures Procedures    Medications Ordered in ED Medications - No data to display  ED Course/ Medical Decision Making/ A&P                                 Medical Decision Making Amount and/or Complexity of Data Reviewed Labs: ordered.   Acute urinary retention.  Triage note states bladder scan showed greater than 575 mL.  I have reviewed her past records, and note operative procedure this morning of cystoscopy with injection of Bulkamid.  I have ordered Foley catheter be placed.  Foley catheter drained approximately 800 mL of clear urine, no evidence of clots or active bleeding.  I am discharging her with Foley catheter in place and advised her to follow-up with her urologist.  I have reviewed her laboratory test, and my interpretation is essentially normal urinalysis.  Final Clinical Impression(s) / ED Diagnoses Final diagnoses:  Urinary retention    Rx / DC Orders ED Discharge Orders     None         Raford Lenis, MD 09/30/23 [phone]

## 2023-09-29 NOTE — Transfer of Care (Signed)
 Immediate Anesthesia Transfer of Care Note  Patient: Heather Fleming  Procedure(s) Performed: CYSTOSCOPY (Urethra) URETHRAL BULKING (Urethra)  Patient Location: PACU  Anesthesia Type:General  Level of Consciousness: awake, alert , oriented, and patient cooperative  Airway & Oxygen Therapy: Patient Spontanous Breathing  Post-op Assessment: Report given to RN and Post -op Vital signs reviewed and stable  Post vital signs: Reviewed and stable  Last Vitals:  Vitals Value Taken Time  BP 113/47 09/29/23 1156  Temp    Pulse 65 09/29/23 1157  Resp 16 09/29/23 1157  SpO2 95 % 09/29/23 1157  Vitals shown include unfiled device data.  Last Pain:  Vitals:   09/29/23 1017  TempSrc: Oral  PainSc: 0-No pain      Patients Stated Pain Goal: 5 (09/29/23 1017)  Complications: No notable events documented.

## 2023-09-29 NOTE — Anesthesia Preprocedure Evaluation (Addendum)
 Anesthesia Evaluation  Patient identified by MRN, date of birth, ID band Patient awake    Reviewed: Allergy  & Precautions, H&P , NPO status , Patient's Chart, lab work & pertinent test results  Airway Mallampati: III  TM Distance: >3 FB Neck ROM: Full    Dental no notable dental hx. (+) Teeth Intact, Dental Advisory Given   Pulmonary neg pulmonary ROS   Pulmonary exam normal breath sounds clear to auscultation       Cardiovascular hypertension, Pt. on medications  Rhythm:Regular Rate:Normal     Neuro/Psych  Headaches  Anxiety Depression       GI/Hepatic Neg liver ROS,GERD  Medicated,,  Endo/Other  diabetes    Renal/GU negative Renal ROS  negative genitourinary   Musculoskeletal   Abdominal   Peds  Hematology negative hematology ROS (+)   Anesthesia Other Findings   Reproductive/Obstetrics negative OB ROS                             Anesthesia Physical Anesthesia Plan  ASA: 2  Anesthesia Plan: MAC   Post-op Pain Management: Tylenol  PO (pre-op)*   Induction: Intravenous  PONV Risk Score and Plan: 3 and Propofol  infusion, Ondansetron  and Dexamethasone  Airway Management Planned: Natural Airway and Simple Face Mask  Additional Equipment:   Intra-op Plan:   Post-operative Plan:   Informed Consent: I have reviewed the patients History and Physical, chart, labs and discussed the procedure including the risks, benefits and alternatives for the proposed anesthesia with the patient or authorized representative who has indicated his/her understanding and acceptance.     Dental advisory given  Plan Discussed with: CRNA  Anesthesia Plan Comments:        Anesthesia Quick Evaluation

## 2023-09-30 LAB — URINALYSIS, ROUTINE W REFLEX MICROSCOPIC
Bacteria, UA: NONE SEEN
Bilirubin Urine: NEGATIVE
Glucose, UA: NEGATIVE mg/dL
Ketones, ur: NEGATIVE mg/dL
Leukocytes,Ua: NEGATIVE
Nitrite: NEGATIVE
Protein, ur: NEGATIVE mg/dL
Specific Gravity, Urine: 1.008 (ref 1.005–1.030)
pH: 6 (ref 5.0–8.0)

## 2023-10-01 ENCOUNTER — Encounter (HOSPITAL_BASED_OUTPATIENT_CLINIC_OR_DEPARTMENT_OTHER): Payer: Self-pay | Admitting: Urology

## 2023-10-01 DIAGNOSIS — R338 Other retention of urine: Secondary | ICD-10-CM | POA: Diagnosis not present

## 2023-10-02 DIAGNOSIS — R311 Benign essential microscopic hematuria: Secondary | ICD-10-CM | POA: Diagnosis not present

## 2023-10-02 DIAGNOSIS — R338 Other retention of urine: Secondary | ICD-10-CM | POA: Diagnosis not present

## 2023-10-03 ENCOUNTER — Encounter: Payer: Self-pay | Admitting: Family Medicine

## 2023-10-05 ENCOUNTER — Other Ambulatory Visit: Payer: Self-pay | Admitting: Family Medicine

## 2023-10-05 MED ORDER — PREGABALIN 75 MG PO CAPS: 75.0000 mg | ORAL_CAPSULE | Freq: Two times a day (BID) | ORAL | 1 refills | Status: DC

## 2023-10-06 NOTE — Telephone Encounter (Signed)
 Heather Fleming, [place]     10/05/23  8:52 PM Rx sent.

## 2023-10-07 DIAGNOSIS — R338 Other retention of urine: Secondary | ICD-10-CM | POA: Diagnosis not present

## 2023-10-13 DIAGNOSIS — R338 Other retention of urine: Secondary | ICD-10-CM | POA: Diagnosis not present

## 2023-10-13 DIAGNOSIS — N3021 Other chronic cystitis with hematuria: Secondary | ICD-10-CM | POA: Diagnosis not present

## 2023-10-14 ENCOUNTER — Other Ambulatory Visit: Payer: Self-pay | Admitting: Family Medicine

## 2023-10-14 ENCOUNTER — Encounter: Payer: Self-pay | Admitting: Neurology

## 2023-10-14 ENCOUNTER — Other Ambulatory Visit: Payer: Self-pay | Admitting: Neurology

## 2023-10-14 MED ORDER — PROPRANOLOL HCL 40 MG PO TABS: 40.0000 mg | ORAL_TABLET | Freq: Two times a day (BID) | ORAL | 1 refills | Status: DC

## 2023-10-14 MED ORDER — PROPRANOLOL HCL 40 MG PO TABS: 40.0000 mg | ORAL_TABLET | Freq: Two times a day (BID) | ORAL | 0 refills | Status: DC

## 2023-11-02 ENCOUNTER — Telehealth: Payer: Self-pay | Admitting: Neurology

## 2023-11-02 NOTE — Telephone Encounter (Signed)
 Pt called and left a message with the access nurse. Pt stated she needs her medication called into the pharmacy. Did not specify which medication or pharmacy

## 2023-11-17 DIAGNOSIS — L814 Other melanin hyperpigmentation: Secondary | ICD-10-CM | POA: Diagnosis not present

## 2023-11-17 DIAGNOSIS — L738 Other specified follicular disorders: Secondary | ICD-10-CM | POA: Diagnosis not present

## 2023-11-17 DIAGNOSIS — I788 Other diseases of capillaries: Secondary | ICD-10-CM | POA: Diagnosis not present

## 2023-11-17 DIAGNOSIS — L720 Epidermal cyst: Secondary | ICD-10-CM | POA: Diagnosis not present

## 2023-11-30 NOTE — Progress Notes (Unsigned)
 NEUROLOGY FOLLOW UP OFFICE NOTE  Leiliana Foody [id number]  Assessment/Plan:   1.  Migraine with and without aura, without status migrainosus, not intractable - stable.  2.  Cervical degenerative disc disease 4.  Idiopathic polyneuropathy    Migraine prevention:  defers for now. For neuropathic pain:  Lyrica 75mg  twice daily (prescribed by her PCP) Follow up 6 months.   Subjective:  Rowan Pollman is a 73 year old right-handed female with HTN, HLD and IBS who follows up for migraines and neuropathy.  UPDATE: Migraines: Discontinued propranolol but due to increased headaches, restarted.  However, she needed a refill in late January.  She uses [facility].  She requested a short supply to the local pharmacy to keep her until she receives the prescription from [facility] in the mail.  [facility] wouldn't approve the standing order since a prescription was sent to the local pharmacy (even though it was for just 10 days).  She hasn't been on propranolol since then and she is doing well.  Polyneuropathy: On Lyrica.  Doing well   Current NSAIDS/analgesics:  ASA 81mg  daily, Tylenol Arthritis, naproxen (upsets stomach) Current triptans:  none Current ergotamine:  none Current anti-emetic:  none Current muscle relaxants:  Baclofen 20mg  Current Antihypertensive medications:  Propranolol 40mg  BID Current Antidepressant medications:  Celexa 20mg  daily Current Anticonvulsant medications:  Lyrica 75mg  BID (neuropathic pain) Current anti-CGRP:  none Current Vitamins/Herbal/Supplements:  none Current Antihistamines/Decongestants:  Xyzal, Flonase Other therapy:  home neck exercises/chiropractor Hormone/birth control:  none   Other pain:  Cervical DDD, stiff neck  Neuropathy: Labs in November 2023 included negative ANA, negative ENA panel (including Sjogren's and ds-DNA), negative RF, normal immunofixation, ACE 40.  HISTORY: Migraines: She has had headaches for decades.  In past,  related to hormones and stress.  Now, she thinks it is related to the arthritis in her neck.  Cervical plain films on 08/13/2020 showed moderate degenerative cervical spondylosis with disc and facet disease at C4-5, C5-6 and C6-7.It is a a moderate-severe holocephalic throbbing head pressure with associated photophobia, phonophobia, and nausea.  On one occasion in August 2021, it was preceded by a visual aura of blue and pink waves in the vision of her left eye.  She had an MRI of the brain MRI of brain without contrast on 05/22/2020 showed scattered nonspecific hyperintense periventricular and subcortical white matter foci likely chronic small vessel ischemic changes.  They typically occur 3 to 4 days and occur once a month.     Past NSAIDS/analgesics:  Excedrin Migraine Past abortive triptans:  none Past abortive ergotamine:  none Past muscle relaxants:  tizanidine Past anti-emetic:  none Past antihypertensive medications:  none Past antidepressant medications:  Amitriptyline  Past anticonvulsant medications:  gabapentin Past anti-CGRP:  none Past vitamins/Herbal/Supplements:  none Past antihistamines/decongestants:  none Other past therapies:  physical therapy  Neuropathy:  She reports history of neuropathy since 2017. She did see neurology at that time.  Labs at that time included Hgb A1c 6.2, sed rate 15, negative RPR, TSH 3.125, and B12 265 . A NCV/EMG was ordered but never scheduled.  She never followed up with the neurologist.  She typically has numbness in the feet.  When laying down, she may get pins and needles sensation radiating up the shins.  Earlier this week, she got up and felt a crick in her back and had urinary incontinence.  A day or two later, she was sitting in the recliner with her legs up and applying  Frankincense oil on her legs when she developed numbness travelling up her legs to the mid thighs.  It lasted into the next day.    She was diagnosed and prescribed Keflex for a  UTI.    Past medications:  amitriptyline, gabapentin  PAST MEDICAL HISTORY: Past Medical History:  Diagnosis Date   Depression    Diabetes mellitus without complication (HCC)    Borderline/years   GERD (gastroesophageal reflux disease)    Hyperlipemia    Hypertension    Pain in right foot 01/11/2020   UTI (urinary tract infection)     MEDICATIONS: Current Outpatient Medications on File Prior to Visit  Medication Sig Dispense Refill   aspirin 81 MG chewable tablet Chew 81 mg by mouth daily.     atorvastatin (LIPITOR) 40 MG tablet TAKE 1 TABLET BY MOUTH DAILY 90 tablet 3   baclofen (LIORESAL) 20 MG tablet Take 20 mg by mouth daily. 10mg  every other night     calcium carbonate (OS-CAL) 1250 (500 Ca) MG chewable tablet Chew 1 tablet by mouth daily.     citalopram (CELEXA) 20 MG tablet Take 1 tablet (20 mg total) by mouth daily. 90 tablet 3   Cranberry-Vitamin C-Probiotic (AZO CRANBERRY PO) Take by mouth.     D-MANNOSE PO Take by mouth. For bladder     estradiol (ESTRACE) 0.1 MG/GM vaginal cream Place 1 Applicatorful vaginally at bedtime.     fluticasone (FLONASE) 50 MCG/ACT nasal spray USE 2 SPRAYS IN BOTH NOSTRILS  DAILY 16 g 2   levocetirizine (XYZAL) 5 MG tablet TAKE 1 TABLET BY MOUTH IN THE  EVENING 90 tablet 0   montelukast (SINGULAIR) 10 MG tablet TAKE 1 TABLET BY MOUTH AT  BEDTIME 90 tablet 3   Multiple Vitamins-Minerals (HAIR SKIN AND NAILS FORMULA PO) Take by mouth.     nitrofurantoin, macrocrystal-monohydrate, (MACROBID) 100 MG capsule Take 100 mg by mouth at bedtime.     NON FORMULARY healthy feet and nerves     omeprazole (PRILOSEC) 20 MG capsule Take 1 capsule (20 mg total) by mouth daily. 90 capsule 1   potassium chloride (KLOR-CON M) 10 MEQ tablet Take 1 tablet (10 mEq total) by mouth 2 (two) times daily. 180 tablet 0   pregabalin (LYRICA) 75 MG capsule Take 1 capsule (75 mg total) by mouth 2 (two) times daily. 180 capsule 1   propranolol (INDERAL) 40 MG tablet Take 1  tablet (40 mg total) by mouth 2 (two) times daily. 180 tablet 1   propranolol (INDERAL) 40 MG tablet Take 1 tablet (40 mg total) by mouth 2 (two) times daily. 20 tablet 0   valsartan-hydrochlorothiazide (DIOVAN-HCT) 160-25 MG tablet Take 1 tablet by mouth daily. 90 tablet 3   No current facility-administered medications on file prior to visit.    ALLERGIES: Allergies  Allergen Reactions   Augmentin [Amoxicillin-Pot Clavulanate] Nausea Only    Upset Stomach   Codeine Itching   Other Diarrhea    Monk fruit sweetner   Azithromycin Nausea Only   Latex Rash    FAMILY HISTORY: Family History  Problem Relation Age of Onset   Allergic rhinitis Mother    Heart disease Mother    Heart failure Mother    Allergic rhinitis Father    Heart disease Father    Asthma Brother        Since a child   Heart attack Brother        1st @ 69, had several, has defibrillator,   Heart  disease Brother    Breast cancer Neg Hx       Objective:  Blood pressure (!) 143/70, pulse 97, height 5\' 3"  (1.6 m), weight 202 lb (91.6 kg), SpO2 95%. General: No acute distress.  Patient appears well-groomed.      Shon Millet, DO  CC:  Everlene Other, DO

## 2023-12-01 ENCOUNTER — Ambulatory Visit: Payer: Medicare Other | Admitting: Neurology

## 2023-12-01 ENCOUNTER — Encounter: Payer: Self-pay | Admitting: Neurology

## 2023-12-01 VITALS — BP 143/70 | HR 97 | Ht 63.0 in | Wt 202.0 lb

## 2023-12-01 DIAGNOSIS — G609 Hereditary and idiopathic neuropathy, unspecified: Secondary | ICD-10-CM

## 2023-12-01 DIAGNOSIS — G43109 Migraine with aura, not intractable, without status migrainosus: Secondary | ICD-10-CM

## 2023-12-09 ENCOUNTER — Ambulatory Visit: Payer: Medicare Other | Admitting: Family Medicine

## 2023-12-09 VITALS — BP 134/78 | HR 93 | Temp 98.1°F | Ht 63.0 in | Wt 203.0 lb

## 2023-12-09 DIAGNOSIS — Z130 Encounter for screening for diseases of the blood and blood-forming organs and certain disorders involving the immune mechanism: Secondary | ICD-10-CM | POA: Diagnosis not present

## 2023-12-09 DIAGNOSIS — E785 Hyperlipidemia, unspecified: Secondary | ICD-10-CM

## 2023-12-09 DIAGNOSIS — I10 Essential (primary) hypertension: Secondary | ICD-10-CM | POA: Diagnosis not present

## 2023-12-09 DIAGNOSIS — F419 Anxiety disorder, unspecified: Secondary | ICD-10-CM

## 2023-12-09 DIAGNOSIS — R635 Abnormal weight gain: Secondary | ICD-10-CM | POA: Diagnosis not present

## 2023-12-09 MED ORDER — VALSARTAN-HYDROCHLOROTHIAZIDE 160-25 MG PO TABS: 1.0000 | ORAL_TABLET | Freq: Every day | ORAL | 3 refills | Status: DC

## 2023-12-09 MED ORDER — CITALOPRAM HYDROBROMIDE 20 MG PO TABS: 20.0000 mg | ORAL_TABLET | Freq: Every day | ORAL | 3 refills | Status: DC
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
## 2023-12-09 NOTE — Assessment & Plan Note (Signed)
 Well-controlled on Lipitor.  Continue.  Lipid panel today.

## 2023-12-09 NOTE — Patient Instructions (Addendum)
 Labs today.  Continue your medications.  Watch BP.  Follow up in 6 months.

## 2023-12-09 NOTE — Assessment & Plan Note (Signed)
 Stable on Celexa.  Refilled.

## 2023-12-09 NOTE — Assessment & Plan Note (Signed)
 Stable. Continue Diovan/hydrochlorothiazide. Refilled today.

## 2023-12-10 ENCOUNTER — Encounter: Payer: Self-pay | Admitting: Family Medicine

## 2023-12-10 LAB — LIPID PANEL
Chol/HDL Ratio: 2.6 ratio (ref 0.0–4.4)
Cholesterol, Total: 159 mg/dL (ref 100–199)
HDL: 62 mg/dL (ref >39)
LDL Chol Calc (NIH): 74 mg/dL (ref 0–99)
Triglycerides: 134 mg/dL (ref 0–149)
VLDL Cholesterol Cal: 23 mg/dL (ref 5–40)

## 2023-12-10 LAB — CMP14+EGFR
ALT: 22 IU/L (ref 0–32)
AST: 26 IU/L (ref 0–40)
Albumin: 4.4 g/dL (ref 3.8–4.8)
Alkaline Phosphatase: 75 IU/L (ref 44–121)
BUN/Creatinine Ratio: 14 (ref 12–28)
BUN: 12 mg/dL (ref 8–27)
Bilirubin Total: 0.4 mg/dL (ref 0.0–1.2)
CO2: 25 mmol/L (ref 20–29)
Calcium: 9.7 mg/dL (ref 8.7–10.3)
Chloride: 98 mmol/L (ref 96–106)
Creatinine, Ser: 0.83 mg/dL (ref 0.57–1.00)
Globulin, Total: 2.6 g/dL (ref 1.5–4.5)
Glucose: 85 mg/dL (ref 70–99)
Potassium: 5 mmol/L (ref 3.5–5.2)
Sodium: 138 mmol/L (ref 134–144)
Total Protein: 7 g/dL (ref 6.0–8.5)
eGFR: 74 mL/min/{1.73_m2} (ref >59)

## 2023-12-10 LAB — CBC
Hematocrit: 39 % (ref 34.0–46.6)
Hemoglobin: 12.9 g/dL (ref 11.1–15.9)
MCH: 30.6 pg (ref 26.6–33.0)
MCHC: 33.1 g/dL (ref 31.5–35.7)
MCV: 93 fL (ref 79–97)
Platelets: 290 10*3/uL (ref 150–450)
RBC: 4.21 x10E6/uL (ref 3.77–5.28)
RDW: 12.6 % (ref 11.7–15.4)
WBC: 8.2 10*3/uL (ref 3.4–10.8)

## 2023-12-10 LAB — TSH: TSH: 1.76 u[IU]/mL (ref 0.450–4.500)

## 2023-12-12 ENCOUNTER — Other Ambulatory Visit: Payer: Self-pay | Admitting: Family Medicine

## 2023-12-30 ENCOUNTER — Other Ambulatory Visit: Payer: Self-pay | Admitting: Family Medicine

## 2023-12-31 ENCOUNTER — Other Ambulatory Visit: Payer: Self-pay | Admitting: Family Medicine

## 2024-01-06 ENCOUNTER — Other Ambulatory Visit: Payer: Self-pay | Admitting: Family Medicine

## 2024-01-06 MED ORDER — MONTELUKAST SODIUM 10 MG PO TABS: 10.0000 mg | ORAL_TABLET | Freq: Every day | ORAL | 3 refills | Status: DC

## 2024-01-06 MED ORDER — OMEPRAZOLE 20 MG PO CPDR: 20.0000 mg | DELAYED_RELEASE_CAPSULE | Freq: Every day | ORAL | 3 refills | Status: AC

## 2024-01-06 MED ORDER — ATORVASTATIN CALCIUM 40 MG PO TABS: 40.0000 mg | ORAL_TABLET | Freq: Every day | ORAL | 3 refills | Status: DC

## 2024-01-12 DIAGNOSIS — N3021 Other chronic cystitis with hematuria: Secondary | ICD-10-CM | POA: Diagnosis not present

## 2024-01-12 DIAGNOSIS — R8271 Bacteriuria: Secondary | ICD-10-CM | POA: Diagnosis not present

## 2024-01-19 ENCOUNTER — Encounter: Payer: Self-pay | Admitting: Neurology

## 2024-01-19 ENCOUNTER — Other Ambulatory Visit: Payer: Self-pay | Admitting: Family Medicine

## 2024-01-19 DIAGNOSIS — I10 Essential (primary) hypertension: Secondary | ICD-10-CM

## 2024-01-19 DIAGNOSIS — F419 Anxiety disorder, unspecified: Secondary | ICD-10-CM

## 2024-01-19 NOTE — Telephone Encounter (Signed)
 Pt. Called about  the following message "Hello, Seems like after I saw you my neuropathy is worse. I'm also getting sleepy after taking my Lyrica . My chiropractor has a light therapy treatment. I know it's now new, my foot Dr had it many years ago. I want your thoughts on it . I'm getting ready scared that eventually I'm going to be in big trouble. Any suggestions??? I haven't gone to the [facility] yet , but I also wonder about all the chemicals in the pool ? Please advise "

## 2024-01-19 NOTE — Telephone Encounter (Unsigned)
 Copied from CRM [id number]. Topic: Clinical - Medication Refill >> Jan 19, 2024 12:54 PM Baldomero Bone wrote: Most Recent Primary Care Visit:  Provider: Myrna Ast  Department: [facility]  Visit Type: OFFICE VISIT  Date: 12/09/2023  Medication: valsartan -hydrochlorothiazide  (DIOVAN -HCT) 160-25 MG tablet  potassium chloride  (KLOR-CON  M) 10 MEQ tablet citalopram  (CELEXA ) 20 MG tablet  Has the patient contacted their pharmacy? Yes (Agent: If no, request that the patient contact the pharmacy for the refill. If patient does not wish to contact the pharmacy document the reason why and proceed with request.) (Agent: If yes, when and what did the pharmacy advise?)  Is this the correct pharmacy for this prescription? Yes If no, delete pharmacy and type the correct one.  This is the patient's preferred pharmacy:  [facility] - [place], [place] - [address] [place] [address] Phone: [phone] Fax: [phone]    Has the prescription been filled recently? No  Is the patient out of the medication? Yes  Has the patient been seen for an appointment in the last year OR does the patient have an upcoming appointment? Yes  Can we respond through MyChart? No  Agent: Please be advised that Rx refills may take up to 3 business days. We ask that you follow-up with your pharmacy.

## 2024-01-21 ENCOUNTER — Telehealth: Payer: Self-pay | Admitting: Neurology

## 2024-01-21 NOTE — Telephone Encounter (Signed)
 Patient advised of note below

## 2024-01-21 NOTE — Telephone Encounter (Signed)
 Left message with the after hour service on 01-21-24    Caller states that she went and left a mychart message to her provider. It said he was out of the office this week. She needs to ask one of hte neurologist  Dr Genita Keys today if possible about light therapy for her feet.  It is at [facility]

## 2024-01-21 NOTE — Telephone Encounter (Signed)
 Attempted to call pt, no contact made, LVM. Pt requesting refills on the following:  valsartan -hydrochlorothiazide  (DIOVAN -HCT) 160-25 MG tablet, potassium chloride  (KLOR-CON  M) 10 MEQ tablet, citalopram  (CELEXA ) 20 MG tablet. Per [facility] pt has refills on all medication.

## 2024-01-21 NOTE — Telephone Encounter (Signed)
 Please let pt know that there is no evidence-based data that light therapy improves neuropathy.  Studies show mixed reviews and some may have anecdotal benefit.  Providers in our office do not recommend alternative therapies such as this, but patient is welcome to explore options, if she chooses.

## 2024-01-25 ENCOUNTER — Other Ambulatory Visit: Payer: Self-pay | Admitting: Family Medicine

## 2024-02-01 ENCOUNTER — Ambulatory Visit: Payer: Self-pay

## 2024-02-01 NOTE — Telephone Encounter (Signed)
 Copied from CRM [id number]. Topic: Clinical - Red Word Triage >> Feb 01, 2024  1:49 PM Alpha Arts wrote: Red Word that prompted transfer to Nurse Triage: Patient was unable to move her neck and mouth yesterday, 5/11. She warmed a towel and placed it on her neck and her neck popped. Pain level is 8.   Above Chief Complaint: Neck pain Symptoms: Above Frequency: yesterday Pertinent Negatives: Patient denies  Disposition: [] ED /[] Urgent Care (no appt availability in office) / [x] Appointment(In office/virtual)/ []  [facility]/ [] Home Care/ [] Refused Recommended Disposition /[] [facility]/ []  Follow-up with PCP Additional Notes: Agrees with appointment.  Reason for Disposition  [1] MODERATE neck pain (e.g., interferes with normal activities) AND [2] present > 3 days  Answer Assessment - Initial Assessment Questions 1. ONSET: "When did the pain begin?"      Yesterday 2. LOCATION: "Where does it hurt?"      Neck 3. PATTERN "Does the pain come and go, or has it been constant since it started?"      Comes and goes 4. SEVERITY: "How bad is the pain?"  (Scale 1-10; or mild, moderate, severe)   - NO PAIN (0): no pain or only slight stiffness    - MILD (1-3): doesn't interfere with normal activities    - MODERATE (4-7): interferes with normal activities or awakens from sleep    - SEVERE (8-10):  excruciating pain, unable to do any normal activities      6 5. RADIATION: "Does the pain go anywhere else, shoot into your arms?"     no 6. CORD SYMPTOMS: "Any weakness or numbness of the arms or legs?"     no 7. CAUSE: "What do you think is causing the neck pain?"     no 8. NECK OVERUSE: "Any recent activities that involved turning or twisting the neck?"     no 9. OTHER SYMPTOMS: "Do you have any other symptoms?" (e.g., headache, fever, chest pain, difficulty breathing, neck swelling)     no 10. PREGNANCY: "Is there any chance you are pregnant?" "When was your last menstrual  period?"       no  Protocols used: Neck Pain or Stiffness-A-AH

## 2024-02-02 ENCOUNTER — Ambulatory Visit: Admitting: Family Medicine

## 2024-02-18 DIAGNOSIS — H5210 Myopia, unspecified eye: Secondary | ICD-10-CM | POA: Diagnosis not present

## 2024-02-18 DIAGNOSIS — H35032 Hypertensive retinopathy, left eye: Secondary | ICD-10-CM | POA: Diagnosis not present

## 2024-03-02 ENCOUNTER — Other Ambulatory Visit: Payer: Self-pay | Admitting: Family Medicine

## 2024-03-02 DIAGNOSIS — F419 Anxiety disorder, unspecified: Secondary | ICD-10-CM

## 2024-03-02 DIAGNOSIS — I10 Essential (primary) hypertension: Secondary | ICD-10-CM

## 2024-03-02 NOTE — Telephone Encounter (Signed)
 Copied from CRM [id number]. Topic: Clinical - Medication Refill >> Mar 02, 2024 12:52 PM Everette C wrote: Medication: potassium chloride  (KLOR-CON  M) 10 MEQ tablet [[id number]]  valsartan -hydrochlorothiazide  (DIOVAN -HCT) 160-25 MG tablet [[id number]]  citalopram  (CELEXA ) 20 MG tablet [[id number]]  Has the patient contacted their pharmacy? Yes (Agent: If no, request that the patient contact the pharmacy for the refill. If patient does not wish to contact the pharmacy document the reason why and proceed with request.) (Agent: If yes, when and what did the pharmacy advise?)  This is the patient's preferred pharmacy:  [facility] - [place], [place] - [address] [place] [address] Phone: [phone] Fax: [phone]  Is this the correct pharmacy for this prescription? Yes If no, delete pharmacy and type the correct one.   Has the prescription been filled recently? Yes  Is the patient out of the medication? No  Has the patient been seen for an appointment in the last year OR does the patient have an upcoming appointment? Yes  Can we respond through MyChart? No  Agent: Please be advised that Rx refills may take up to 3 business days. We ask that you follow-up with your pharmacy.

## 2024-03-04 MED ORDER — POTASSIUM CHLORIDE CRYS ER 10 MEQ PO TBCR: 10.0000 meq | EXTENDED_RELEASE_TABLET | Freq: Two times a day (BID) | ORAL | 3 refills | Status: DC

## 2024-03-04 MED ORDER — VALSARTAN-HYDROCHLOROTHIAZIDE 160-25 MG PO TABS: 1.0000 | ORAL_TABLET | Freq: Every day | ORAL | 3 refills | Status: DC

## 2024-03-06 MED ORDER — CITALOPRAM HYDROBROMIDE 20 MG PO TABS: 20.0000 mg | ORAL_TABLET | Freq: Every day | ORAL | 3 refills | Status: DC

## 2024-03-22 ENCOUNTER — Encounter: Payer: Self-pay | Admitting: Family Medicine

## 2024-03-22 ENCOUNTER — Ambulatory Visit (INDEPENDENT_AMBULATORY_CARE_PROVIDER_SITE_OTHER): Admitting: Family Medicine

## 2024-03-22 VITALS — BP 136/84 | HR 82 | Temp 98.0°F | Ht 63.0 in | Wt 197.0 lb

## 2024-03-22 DIAGNOSIS — R399 Unspecified symptoms and signs involving the genitourinary system: Secondary | ICD-10-CM

## 2024-03-22 DIAGNOSIS — E785 Hyperlipidemia, unspecified: Secondary | ICD-10-CM | POA: Diagnosis not present

## 2024-03-22 DIAGNOSIS — I10 Essential (primary) hypertension: Secondary | ICD-10-CM

## 2024-03-22 LAB — POCT URINE DIPSTICK
Bilirubin, UA: NEGATIVE
Blood, UA: NEGATIVE
Glucose, UA: NEGATIVE mg/dL
Ketones, POC UA: NEGATIVE mg/dL
Nitrite, UA: NEGATIVE
POC PROTEIN,UA: NEGATIVE
Spec Grav, UA: 1.02 (ref 1.010–1.025)
Urobilinogen, UA: 0.2 U/dL
pH, UA: 6.5 (ref 5.0–8.0)

## 2024-03-22 MED ORDER — CEPHALEXIN 500 MG PO CAPS: 500.0000 mg | ORAL_CAPSULE | Freq: Two times a day (BID) | ORAL | 0 refills | Status: DC

## 2024-03-22 NOTE — Patient Instructions (Signed)
 Medication as prescribed.  Sending culture.

## 2024-03-23 DIAGNOSIS — R399 Unspecified symptoms and signs involving the genitourinary system: Secondary | ICD-10-CM | POA: Insufficient documentation

## 2024-03-23 NOTE — Assessment & Plan Note (Signed)
Fairly well controlled.  Continue Lipitor.

## 2024-03-23 NOTE — Assessment & Plan Note (Signed)
 Concern for UTI.  Sending culture.  Placing on Keflex while awaiting culture.
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
## 2024-03-23 NOTE — Assessment & Plan Note (Signed)
Stable.  Continue valsartan/HCTZ.

## 2024-03-25 LAB — URINE CULTURE

## 2024-03-25 LAB — SPECIMEN STATUS REPORT

## 2024-05-02 ENCOUNTER — Other Ambulatory Visit: Payer: Self-pay | Admitting: Family Medicine

## 2024-05-02 ENCOUNTER — Telehealth: Payer: Self-pay | Admitting: Family Medicine

## 2024-05-02 MED ORDER — PREGABALIN 50 MG PO CAPS: 50.0000 mg | ORAL_CAPSULE | Freq: Two times a day (BID) | ORAL | 1 refills | Status: DC

## 2024-05-02 NOTE — Telephone Encounter (Signed)
 Pt of Dr Bluford with [facility] not [facility]. Thanks.

## 2024-05-02 NOTE — Telephone Encounter (Signed)
 Copied from CRM [id number]. Topic: Clinical - Prescription Issue >> May 02, 2024  9:49 AM Aleatha C wrote: Reason for CRM: Patient is saying that pregabalin  (LYRICA ) 75 MG capsule is making her sleepy and wants she should be taking , and if you would like her to deal with Dr Jeffery instead can you let her know

## 2024-05-12 DIAGNOSIS — H01004 Unspecified blepharitis left upper eyelid: Secondary | ICD-10-CM | POA: Diagnosis not present

## 2024-05-13 ENCOUNTER — Other Ambulatory Visit: Payer: Self-pay | Admitting: Family Medicine

## 2024-05-13 DIAGNOSIS — Z1231 Encounter for screening mammogram for malignant neoplasm of breast: Secondary | ICD-10-CM

## 2024-05-17 ENCOUNTER — Ambulatory Visit
Admission: RE | Admit: 2024-05-17 | Discharge: 2024-05-17 | Disposition: A | Discharge status: Home / Self Care | Source: Ambulatory Visit | Attending: Family Medicine | Admitting: Family Medicine

## 2024-05-17 DIAGNOSIS — Z1231 Encounter for screening mammogram for malignant neoplasm of breast: Secondary | ICD-10-CM

## 2024-05-30 ENCOUNTER — Other Ambulatory Visit: Payer: Self-pay | Admitting: Family Medicine

## 2024-05-30 ENCOUNTER — Other Ambulatory Visit: Payer: Self-pay

## 2024-05-30 DIAGNOSIS — F419 Anxiety disorder, unspecified: Secondary | ICD-10-CM

## 2024-05-30 DIAGNOSIS — I10 Essential (primary) hypertension: Secondary | ICD-10-CM

## 2024-05-30 MED ORDER — MONTELUKAST SODIUM 10 MG PO TABS: 10.0000 mg | ORAL_TABLET | Freq: Every day | ORAL | 3 refills | Status: AC

## 2024-05-30 MED ORDER — LEVOCETIRIZINE DIHYDROCHLORIDE 5 MG PO TABS: 5.0000 mg | ORAL_TABLET | Freq: Every evening | ORAL | 3 refills | Status: AC

## 2024-05-30 MED ORDER — ATORVASTATIN CALCIUM 40 MG PO TABS: 40.0000 mg | ORAL_TABLET | Freq: Every day | ORAL | 3 refills | Status: AC

## 2024-05-30 MED ORDER — VALSARTAN-HYDROCHLOROTHIAZIDE 160-25 MG PO TABS: 1.0000 | ORAL_TABLET | Freq: Every day | ORAL | 3 refills | Status: AC

## 2024-05-30 MED ORDER — CITALOPRAM HYDROBROMIDE 20 MG PO TABS: 20.0000 mg | ORAL_TABLET | Freq: Every day | ORAL | 3 refills | Status: DC

## 2024-05-30 MED ORDER — POTASSIUM CHLORIDE CRYS ER 10 MEQ PO TBCR: 10.0000 meq | EXTENDED_RELEASE_TABLET | Freq: Two times a day (BID) | ORAL | 3 refills | Status: AC

## 2024-05-30 NOTE — Telephone Encounter (Signed)
 Copied from CRM [id number]. Topic: Clinical - Medication Refill >> May 30, 2024 11:28 AM [name] wrote: Medication: Valsartan  , Levocetirizine , Montelukast  , Citalopram  , Atorvastatin  , Potassium   Has the patient contacted their pharmacy? Yes (Agent: If no, request that the patient contact the pharmacy for the refill. If patient does not wish to contact the pharmacy document the reason why and proceed with request.) (Agent: If yes, when and what did the pharmacy advise?)  This is the patient's preferred pharmacy:  [facility] - [place], [place] - [address] [place] [address] Phone: [phone] Fax: [phone]  Is this the correct pharmacy for this prescription? Yes If no, delete pharmacy and type the correct one.   Has the prescription been filled recently? No  Is the patient out of the medication? No, Almost out  Has the patient been seen for an appointment in the last year OR does the patient have an upcoming appointment? Yes  Can we respond through MyChart? No  Agent: Please be advised that Rx refills may take up to 3 business days. We ask that you follow-up with your pharmacy.

## 2024-06-06 NOTE — Progress Notes (Unsigned)
 NEUROLOGY FOLLOW UP OFFICE NOTE  Heather Fleming [id number]  Assessment/Plan:   Migraine with and without aura, without status migrainosus, not intractable - stable.  Idiopathic polyneuropathy Cervical degenerative disc disease     Migraine prevention:  Propranolol  40mg  twice daily For neuropathic pain:  Lyrica  75mg  twice daily (prescribed by her PCP) Follow up ***  Subjective:  Heather Fleming is a 73 year old right-handed female with HTN, HLD and IBS who follows up for migraines and neuropathy.  UPDATE: Migraines: On propranolol . ***  Polyneuropathy: On Lyrica .  Doing well   Current NSAIDS/analgesics:  ASA 81mg  daily, Tylenol  Arthritis, naproxen (upsets stomach) Current triptans:  none Current ergotamine:  none Current anti-emetic:  none Current muscle relaxants:  Baclofen 20mg  Current Antihypertensive medications:  Propranolol  40mg  BID Current Antidepressant medications:  Celexa  20mg  daily Current Anticonvulsant medications:  Lyrica  75mg  BID (neuropathic pain) Current anti-CGRP:  none Current Vitamins/Herbal/Supplements:  none Current Antihistamines/Decongestants:  Xyzal , Flonase  Other therapy:  home neck exercises/chiropractor Hormone/birth control:  none   Other pain:  Cervical DDD, stiff neck  Neuropathy: Labs in November 2023 included negative ANA, negative ENA panel (including Sjogren's and ds-DNA), negative RF, normal immunofixation, ACE 40.  HISTORY: Migraines: She has had headaches for decades.  In past, related to hormones and stress.  Now, she thinks it is related to the arthritis in her neck.  Cervical plain films on 08/13/2020 showed moderate degenerative cervical spondylosis with disc and facet disease at C4-5, C5-6 and C6-7.It is a a moderate-severe holocephalic throbbing head pressure with associated photophobia, phonophobia, and nausea.  On one occasion in August 2021, it was preceded by a visual aura of blue and pink waves in the  vision of her left eye.  She had an MRI of the brain MRI of brain without contrast on 05/22/2020 showed scattered nonspecific hyperintense periventricular and subcortical white matter foci likely chronic small vessel ischemic changes.  They typically occur 3 to 4 days and occur once a month.     Past NSAIDS/analgesics:  Excedrin Migraine Past abortive triptans:  none Past abortive ergotamine:  none Past muscle relaxants:  tizanidine Past anti-emetic:  none Past antihypertensive medications:  none Past antidepressant medications:  Amitriptyline  Past anticonvulsant medications:  gabapentin Past anti-CGRP:  none Past vitamins/Herbal/Supplements:  none Past antihistamines/decongestants:  none Other past therapies:  physical therapy  Neuropathy:  She reports history of neuropathy since 2017. She did see neurology at that time.  Labs at that time included Hgb A1c 6.2, sed rate 15, negative RPR, TSH 3.125, and B12 265 . A NCV/EMG was ordered but never scheduled.  She never followed up with the neurologist.  She typically has numbness in the feet.  When laying down, she may get pins and needles sensation radiating up the shins.  Earlier this week, she got up and felt a crick in her back and had urinary incontinence.  A day or two later, she was sitting in the recliner with her legs up and applying Frankincense oil on her legs when she developed numbness travelling up her legs to the mid thighs.  It lasted into the next day.    She was diagnosed and prescribed Keflex  for a UTI.    Past medications:  amitriptyline, gabapentin  PAST MEDICAL HISTORY: Past Medical History:  Diagnosis Date   Depression    Diabetes mellitus without complication (HCC)    Borderline/years   GERD (gastroesophageal reflux disease)    Hyperlipemia    Hypertension  Pain in right foot 01/11/2020   UTI (urinary tract infection)     MEDICATIONS: Current Outpatient Medications on File Prior to Visit  Medication Sig  Dispense Refill   aspirin 81 MG chewable tablet Chew 81 mg by mouth daily.     atorvastatin  (LIPITOR) 40 MG tablet Take 1 tablet (40 mg total) by mouth daily. 90 tablet 3   baclofen (LIORESAL) 20 MG tablet Take 20 mg by mouth daily. 10mg  every other night     citalopram  (CELEXA ) 20 MG tablet Take 1 tablet (20 mg total) by mouth daily. 90 tablet 3   Cranberry-Vitamin C-Probiotic (AZO CRANBERRY PO) Take by mouth.     D-MANNOSE PO Take by mouth. For bladder     estradiol (ESTRACE) 0.1 MG/GM vaginal cream Place 1 Applicatorful vaginally at bedtime.     fluticasone  (FLONASE ) 50 MCG/ACT nasal spray USE 2 SPRAYS IN BOTH NOSTRILS  DAILY 48 g 3   levocetirizine (XYZAL ) 5 MG tablet Take 1 tablet (5 mg total) by mouth every evening. 90 tablet 3   montelukast  (SINGULAIR ) 10 MG tablet Take 1 tablet (10 mg total) by mouth at bedtime. 90 tablet 3   Multiple Vitamins-Minerals (HAIR SKIN AND NAILS FORMULA PO) Take by mouth.     nitrofurantoin , macrocrystal-monohydrate, (MACROBID ) 100 MG capsule Take 100 mg by mouth at bedtime.     NON FORMULARY healthy feet and nerves     omeprazole  (PRILOSEC) 20 MG capsule Take 1 capsule (20 mg total) by mouth daily. 90 capsule 3   potassium chloride  (KLOR-CON  M) 10 MEQ tablet Take 1 tablet (10 mEq total) by mouth 2 (two) times daily. 180 tablet 3   pregabalin  (LYRICA ) 50 MG capsule Take 1 capsule (50 mg total) by mouth 2 (two) times daily. 180 capsule 1   valsartan -hydrochlorothiazide  (DIOVAN -HCT) 160-25 MG tablet Take 1 tablet by mouth daily. 90 tablet 3   No current facility-administered medications on file prior to visit.    ALLERGIES: Allergies  Allergen Reactions   Augmentin [Amoxicillin-Pot Clavulanate] Nausea Only    Upset Stomach   Codeine Itching   Other Diarrhea    Monk fruit sweetner   Azithromycin Nausea Only   Latex Rash    FAMILY HISTORY: Family History  Problem Relation Age of Onset   Allergic rhinitis Mother    Heart disease Mother    Heart  failure Mother    Allergic rhinitis Father    Heart disease Father    Asthma Brother        Since a child   Heart attack Brother        1st @ 39, had several, has defibrillator,   Heart disease Brother    Breast cancer Neg Hx       Objective:  *** General: No acute distress.  Patient appears well-groomed.   Head:  Normocephalic/atraumatic Neck:  Supple.  No paraspinal tenderness.  Full range of motion. Heart:  Regular rate and rhythm. Neuro:  Alert and oriented.  Speech fluent and not dysarthric.  Language intact.  CN II-XII intact.  Bulk and tone normal.  Muscle strength 5/5 throughout.  Sensation to light touch intact.  Deep tendon reflexes 2+ throughout, toes downgoing.  Gait normal.  Romberg negative.    Juliene Dunnings, DO  CC:  Jacqulyn Ahle, DO

## 2024-06-07 ENCOUNTER — Encounter: Payer: Self-pay | Admitting: Neurology

## 2024-06-07 ENCOUNTER — Ambulatory Visit: Admitting: Neurology

## 2024-06-07 VITALS — BP 130/76 | HR 100 | Ht 64.0 in | Wt 192.0 lb

## 2024-06-07 DIAGNOSIS — G609 Hereditary and idiopathic neuropathy, unspecified: Secondary | ICD-10-CM | POA: Diagnosis not present

## 2024-06-07 DIAGNOSIS — G43009 Migraine without aura, not intractable, without status migrainosus: Secondary | ICD-10-CM

## 2024-06-07 DIAGNOSIS — H93A2 Pulsatile tinnitus, left ear: Secondary | ICD-10-CM | POA: Diagnosis not present

## 2024-06-07 MED ORDER — DULOXETINE HCL 30 MG PO CPEP: 30.0000 mg | ORAL_CAPSULE | Freq: Every day | ORAL | 5 refills | Status: DC

## 2024-06-07 NOTE — Patient Instructions (Signed)
 CHECK CTA OF HEAD AND NECK STOP CITALOPRAM .  START DULOXETINE  30MG  DAILY.  WE CAN INCREASE DOSE IN 8 WEEKS IF NEEDED  FOLLOW UP 6 MONTHS.

## 2024-06-07 NOTE — Addendum Note (Signed)
 Addended by: TERRIL CHARLIES MATSU on: 06/07/2024 02:04 PM   Modules accepted: Orders

## 2024-06-09 ENCOUNTER — Ambulatory Visit: Admitting: Family Medicine

## 2024-06-09 ENCOUNTER — Encounter: Payer: Self-pay | Admitting: Family Medicine

## 2024-06-09 VITALS — BP 133/79 | HR 83 | Temp 97.7°F | Ht 64.0 in | Wt 194.0 lb

## 2024-06-09 DIAGNOSIS — E785 Hyperlipidemia, unspecified: Secondary | ICD-10-CM

## 2024-06-09 DIAGNOSIS — K219 Gastro-esophageal reflux disease without esophagitis: Secondary | ICD-10-CM

## 2024-06-09 DIAGNOSIS — H93A9 Pulsatile tinnitus, unspecified ear: Secondary | ICD-10-CM

## 2024-06-09 DIAGNOSIS — I10 Essential (primary) hypertension: Secondary | ICD-10-CM | POA: Diagnosis not present

## 2024-06-09 DIAGNOSIS — Z130 Encounter for screening for diseases of the blood and blood-forming organs and certain disorders involving the immune mechanism: Secondary | ICD-10-CM | POA: Diagnosis not present

## 2024-06-09 NOTE — Patient Instructions (Signed)
Labs today.  Follow up in 6 months.  Take care  Dr. Alyxis Grippi  

## 2024-06-10 LAB — CMP14+EGFR
ALT: 21 IU/L (ref 0–32)
AST: 24 IU/L (ref 0–40)
Albumin: 4.4 g/dL (ref 3.8–4.8)
Alkaline Phosphatase: 76 IU/L (ref 49–135)
BUN/Creatinine Ratio: 12 (ref 12–28)
BUN: 9 mg/dL (ref 8–27)
Bilirubin Total: 0.4 mg/dL (ref 0.0–1.2)
CO2: 22 mmol/L (ref 20–29)
Calcium: 9.3 mg/dL (ref 8.7–10.3)
Chloride: 97 mmol/L (ref 96–106)
Creatinine, Ser: 0.77 mg/dL (ref 0.57–1.00)
Globulin, Total: 2.7 g/dL (ref 1.5–4.5)
Glucose: 86 mg/dL (ref 70–99)
Potassium: 4.2 mmol/L (ref 3.5–5.2)
Sodium: 135 mmol/L (ref 134–144)
Total Protein: 7.1 g/dL (ref 6.0–8.5)
eGFR: 80 mL/min/1.73 (ref >59)

## 2024-06-10 LAB — LIPID PANEL
Chol/HDL Ratio: 2.3 ratio (ref 0.0–4.4)
Cholesterol, Total: 157 mg/dL (ref 100–199)
HDL: 69 mg/dL (ref >39)
LDL Chol Calc (NIH): 69 mg/dL (ref 0–99)
Triglycerides: 108 mg/dL (ref 0–149)
VLDL Cholesterol Cal: 19 mg/dL (ref 5–40)

## 2024-06-10 LAB — CBC
Hematocrit: 36.6 % (ref 34.0–46.6)
Hemoglobin: 11.8 g/dL (ref 11.1–15.9)
MCH: 29 pg (ref 26.6–33.0)
MCHC: 32.2 g/dL (ref 31.5–35.7)
MCV: 90 fL (ref 79–97)
Platelets: 329 x10E3/uL (ref 150–450)
RBC: 4.07 x10E6/uL (ref 3.77–5.28)
RDW: 13.4 % (ref 11.7–15.4)
WBC: 8.9 x10E3/uL (ref 3.4–10.8)

## 2024-06-10 LAB — SEDIMENTATION RATE: Sed Rate: 8 mm/h (ref 0–40)

## 2024-06-12 ENCOUNTER — Ambulatory Visit: Payer: Self-pay | Admitting: Family Medicine

## 2024-06-12 DIAGNOSIS — H93A9 Pulsatile tinnitus, unspecified ear: Secondary | ICD-10-CM | POA: Insufficient documentation
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
## 2024-06-12 NOTE — Assessment & Plan Note (Signed)
 Stable. Continue Valsartan /hydrochlorothiazide .

## 2024-06-12 NOTE — Assessment & Plan Note (Signed)
 Stable

## 2024-06-12 NOTE — Assessment & Plan Note (Signed)
 Imaging has been scheduled. Obtaining ESR.

## 2024-06-12 NOTE — Assessment & Plan Note (Signed)
 LDL returned well controlled. Continue Lipitor.

## 2024-06-13 ENCOUNTER — Ambulatory Visit
Admission: RE | Admit: 2024-06-13 | Discharge: 2024-06-13 | Disposition: A | Discharge status: Home / Self Care | Source: Ambulatory Visit | Attending: Neurology | Admitting: Neurology

## 2024-06-13 ENCOUNTER — Other Ambulatory Visit

## 2024-06-13 DIAGNOSIS — H93A2 Pulsatile tinnitus, left ear: Secondary | ICD-10-CM

## 2024-06-13 MED ORDER — IOPAMIDOL (ISOVUE-370) INJECTION 76%
75.0000 mL | Freq: Once | INTRAVENOUS | Status: AC | PRN
  Administered 2024-06-13: 75 mL via INTRAVENOUS

## 2024-06-15 ENCOUNTER — Telehealth: Payer: Self-pay | Admitting: Neurology

## 2024-06-15 ENCOUNTER — Ambulatory Visit: Payer: Self-pay | Admitting: Neurology

## 2024-06-15 NOTE — Telephone Encounter (Signed)
 Please call patient and let her know if we have the results of the CT scan or what the results are if we have them

## 2024-06-15 NOTE — Telephone Encounter (Signed)
 Advised patient results haven't been received yet.

## 2024-06-16 ENCOUNTER — Encounter: Payer: Self-pay | Admitting: Neurology

## 2024-06-17 ENCOUNTER — Telehealth: Payer: Self-pay

## 2024-06-17 NOTE — Progress Notes (Signed)
 Patient advised.

## 2024-06-17 NOTE — Telephone Encounter (Signed)
 All the CTA shows is some mild plaque build up, not unexpected in somebody her age, but nothing that requires change in current management.

## 2024-06-17 NOTE — Telephone Encounter (Signed)
 Patient advised of her results.

## 2024-07-25 NOTE — Progress Notes (Signed)
 Heather Fleming                                          MRN: [id number]   07/25/2024   The [facility] Quality Team Specialist reviewed this patient medical record for the purposes of chart review for care gap closure. The following were reviewed: chart review for care gap closure-glycemic status assessment and kidney health evaluation for diabetes:eGFR  and uACR.    [facility] Quality Team

## 2024-08-04 ENCOUNTER — Other Ambulatory Visit: Payer: Self-pay | Admitting: Family Medicine

## 2024-09-09 ENCOUNTER — Encounter: Payer: Self-pay | Admitting: Family Medicine

## 2024-09-09 ENCOUNTER — Ambulatory Visit: Admitting: Family Medicine

## 2024-09-09 VITALS — BP 149/84 | HR 90 | Temp 98.1°F | Ht 64.0 in | Wt 196.6 lb

## 2024-09-09 DIAGNOSIS — N390 Urinary tract infection, site not specified: Secondary | ICD-10-CM

## 2024-09-09 LAB — POCT URINE DIPSTICK
Bilirubin, UA: NEGATIVE
Glucose, UA: NEGATIVE mg/dL
Ketones, POC UA: NEGATIVE mg/dL
Nitrite, UA: NEGATIVE
POC PROTEIN,UA: NEGATIVE
Spec Grav, UA: 1.015
Urobilinogen, UA: NEGATIVE U/dL — AB
pH, UA: 6

## 2024-09-09 MED ORDER — CEFDINIR 300 MG PO CAPS: 300.0000 mg | ORAL_CAPSULE | Freq: Two times a day (BID) | ORAL | 0 refills | Status: DC

## 2024-09-09 NOTE — Patient Instructions (Signed)
 Medication as prescribed.  Merry Christmas.
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
## 2024-09-11 NOTE — Assessment & Plan Note (Signed)
 UA consistent with UTI.  Treating with Omnicef  as will cover both urinary tract as well as respiratory tract.

## 2024-09-12 ENCOUNTER — Ambulatory Visit: Payer: Self-pay | Admitting: Family Medicine

## 2024-09-12 LAB — URINE CULTURE

## 2024-09-20 NOTE — Progress Notes (Signed)
 Called patient and left a voicemail

## 2024-09-21 NOTE — Progress Notes (Signed)
 Called patient and left voicemail stating we are mailing her results

## 2024-09-29 ENCOUNTER — Encounter: Payer: Self-pay | Admitting: Family Medicine

## 2024-09-29 ENCOUNTER — Ambulatory Visit: Admitting: Family Medicine

## 2024-09-29 VITALS — BP 135/72 | HR 89 | Temp 97.7°F | Ht 64.0 in | Wt 195.8 lb

## 2024-09-29 DIAGNOSIS — R55 Syncope and collapse: Secondary | ICD-10-CM

## 2024-09-29 NOTE — Patient Instructions (Signed)
Rest.  Supportive care.  Take care  Dr. Adriana Simasook

## 2024-10-02 DIAGNOSIS — R55 Syncope and collapse: Secondary | ICD-10-CM | POA: Insufficient documentation
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
## 2024-10-02 NOTE — Assessment & Plan Note (Signed)
 History consistent with vasovagal syncope.  EKG unremarkable. Advised that if this occurs, she will need labs, holter, and referral to cardiology. Supportive care.

## 2024-10-17 NOTE — Progress Notes (Signed)
 Heather Fleming                                          MRN: [id number]   10/17/2024   The [facility] Quality Team Specialist reviewed this patient medical record for the purposes of chart review for care gap closure. The following were reviewed: chart review for care gap closure-glycemic status assessment.    [facility] Quality Team

## 2024-10-21 NOTE — Progress Notes (Signed)
 Heather Fleming                                          MRN: [id number]   10/21/2024   The [facility] Quality Team Specialist reviewed this patient medical record for the purposes of chart review for care gap closure. The following were reviewed: chart review for care gap closure-glycemic status assessment.    [facility] Quality Team

## 2024-11-23 ENCOUNTER — Ambulatory Visit: Admitting: Neurology

## 2024-12-02 ENCOUNTER — Ambulatory Visit
